# Patient Record
Sex: Male | Born: 1945
Health system: Southern US, Community
[De-identification: ages and names within clinical notes are randomized; demographics above are authoritative.]

## PROBLEM LIST (undated history)

## (undated) DIAGNOSIS — S3282XA Multiple fractures of pelvis without disruption of pelvic ring, initial encounter for closed fracture: Secondary | ICD-10-CM

## (undated) DIAGNOSIS — N35013 Post-traumatic anterior urethral stricture: Secondary | ICD-10-CM

## (undated) DIAGNOSIS — R972 Elevated prostate specific antigen [PSA]: Secondary | ICD-10-CM

## (undated) DIAGNOSIS — K439 Ventral hernia without obstruction or gangrene: Secondary | ICD-10-CM

## (undated) DIAGNOSIS — C61 Malignant neoplasm of prostate: Secondary | ICD-10-CM

## (undated) DIAGNOSIS — N320 Bladder-neck obstruction: Secondary | ICD-10-CM

---

## 2001-11-29 ENCOUNTER — Ambulatory Visit (HOSPITAL_COMMUNITY): Admission: RE | Admit: 2001-11-29 | Discharge: 2001-11-29 | Payer: Self-pay | Admitting: Gastroenterology

## 2001-11-29 ENCOUNTER — Encounter (INDEPENDENT_AMBULATORY_CARE_PROVIDER_SITE_OTHER): Payer: Self-pay | Admitting: *Deleted

## 2002-08-24 ENCOUNTER — Ambulatory Visit (HOSPITAL_COMMUNITY): Admission: RE | Admit: 2002-08-24 | Discharge: 2002-08-24 | Payer: Self-pay

## 2003-07-18 ENCOUNTER — Ambulatory Visit (HOSPITAL_COMMUNITY): Admission: RE | Admit: 2003-07-18 | Discharge: 2003-07-18 | Payer: Self-pay | Admitting: Sports Medicine

## 2010-02-06 ENCOUNTER — Encounter: Admission: RE | Admit: 2010-02-06 | Discharge: 2010-02-06 | Payer: Self-pay | Admitting: Internal Medicine

## 2010-08-02 NOTE — Op Note (Signed)
   NAME:  [facility], [facility] NO.:  [id number]   MEDICAL RECORD NO.:  [id number]                   PATIENT TYPE:  AMB   LOCATION:  ENDO                                 FACILITY:  [facility]   PHYSICIAN:  Florencia Reasons, M.D.             DATE OF BIRTH:  1945/10/21   DATE OF PROCEDURE:  11/29/2001  DATE OF DISCHARGE:                                 OPERATIVE REPORT   PROCEDURE:  Colonoscopy with polypectomy and biopsy.   INDICATIONS:  A 65 year old, for colon cancer screening.  Does have  occasional rectal bleeding.   FINDINGS:  One medium polyp and one small polyp removed.  Sigmoid  diverticulosis.   DESCRIPTION OF PROCEDURE:  The nature, purpose, and risks of the procedure  had been discussed with the patient, who provided written consent.  Sedation  was fentanyl 75 mcg and Versed 5 mg IV without arrhythmias or desaturation.  Digital exam of the prostate was normal.   The Olympus adjustable-tension pediatric video colonoscope was advanced with  slight looping to the cecum, using some external abdominal compression to  decrease looping and help advancement.  Pullback was then performed.  The  quality of the prep was excellent, and it is felt that all areas were well-  seen.   On the way in (measured at about 60 cm on pullback), I encountered a  pedunculated 8 mm polyp on a thin stalk, removed by snare technique with  complete hemostasis and no evidence of excessive cautery.   In the proximal ascending colon, there was a small sessile polyp removed by  two cold biopsies.   No large polyps, cancer, colitis, or vascular malformations were observed.   There was moderate sigmoid diverticulosis.   Retroflexion in the rectum was normal, as was reinspection of the  rectosigmoid.   The patient tolerated the procedure well, and there were no apparent  complications.    IMPRESSION:  1. Two colon polyps removed as described as above.  2. Sigmoid  diverticulosis.   PLAN:  Await pathology on the polyps.                                               Florencia Reasons, M.D.    RVB/MEDQ  D:  11/29/2001  T:  11/29/2001  Job:  [id number]   cc:   Ike Bene, MD  [address]  [place]  [place] [address]  Fax: [phone]

## 2011-04-01 ENCOUNTER — Other Ambulatory Visit: Payer: Self-pay | Admitting: Dermatology

## 2012-08-18 ENCOUNTER — Other Ambulatory Visit: Payer: Self-pay | Admitting: Gastroenterology

## 2013-03-17 DIAGNOSIS — N35013 Post-traumatic anterior urethral stricture: Secondary | ICD-10-CM

## 2013-03-17 DIAGNOSIS — R972 Elevated prostate specific antigen [PSA]: Secondary | ICD-10-CM

## 2013-03-17 DIAGNOSIS — N320 Bladder-neck obstruction: Secondary | ICD-10-CM

## 2013-03-17 DIAGNOSIS — K439 Ventral hernia without obstruction or gangrene: Secondary | ICD-10-CM

## 2013-03-17 DIAGNOSIS — C61 Malignant neoplasm of prostate: Secondary | ICD-10-CM

## 2013-03-17 HISTORY — DX: Post-traumatic anterior urethral stricture: N35.013

## 2013-03-17 HISTORY — DX: Bladder-neck obstruction: N32.0

## 2013-03-17 HISTORY — DX: Ventral hernia without obstruction or gangrene: K43.9

## 2013-03-17 HISTORY — DX: Malignant neoplasm of prostate: C61

## 2013-03-17 HISTORY — DX: Elevated prostate specific antigen (PSA): R97.20

## 2013-06-24 HISTORY — PX: PROSTATE BIOPSY: SHX241

## 2013-10-11 HISTORY — PX: ROBOT ASSISTED LAPAROSCOPIC RADICAL PROSTATECTOMY: SHX5141

## 2014-01-05 HISTORY — PX: INTERNAL URETHROTOMY: SHX1835

## 2014-01-05 HISTORY — PX: CYSTOSCOPY: SUR368

## 2015-04-05 DIAGNOSIS — L812 Freckles: Secondary | ICD-10-CM | POA: Diagnosis not present

## 2015-04-05 DIAGNOSIS — L570 Actinic keratosis: Secondary | ICD-10-CM | POA: Diagnosis not present

## 2015-04-05 DIAGNOSIS — L308 Other specified dermatitis: Secondary | ICD-10-CM | POA: Diagnosis not present

## 2015-04-05 DIAGNOSIS — D225 Melanocytic nevi of trunk: Secondary | ICD-10-CM | POA: Diagnosis not present

## 2015-04-05 DIAGNOSIS — L821 Other seborrheic keratosis: Secondary | ICD-10-CM | POA: Diagnosis not present

## 2015-04-05 DIAGNOSIS — D1801 Hemangioma of skin and subcutaneous tissue: Secondary | ICD-10-CM | POA: Diagnosis not present

## 2015-11-29 DIAGNOSIS — E784 Other hyperlipidemia: Secondary | ICD-10-CM | POA: Diagnosis not present

## 2015-11-29 DIAGNOSIS — Z125 Encounter for screening for malignant neoplasm of prostate: Secondary | ICD-10-CM | POA: Diagnosis not present

## 2015-11-29 DIAGNOSIS — R7301 Impaired fasting glucose: Secondary | ICD-10-CM | POA: Diagnosis not present

## 2015-11-29 DIAGNOSIS — M859 Disorder of bone density and structure, unspecified: Secondary | ICD-10-CM | POA: Diagnosis not present

## 2015-12-06 DIAGNOSIS — E784 Other hyperlipidemia: Secondary | ICD-10-CM | POA: Diagnosis not present

## 2015-12-06 DIAGNOSIS — D7589 Other specified diseases of blood and blood-forming organs: Secondary | ICD-10-CM | POA: Diagnosis not present

## 2015-12-06 DIAGNOSIS — F5221 Male erectile disorder: Secondary | ICD-10-CM | POA: Diagnosis not present

## 2015-12-06 DIAGNOSIS — Z23 Encounter for immunization: Secondary | ICD-10-CM | POA: Diagnosis not present

## 2015-12-06 DIAGNOSIS — C61 Malignant neoplasm of prostate: Secondary | ICD-10-CM | POA: Diagnosis not present

## 2015-12-06 DIAGNOSIS — M25551 Pain in right hip: Secondary | ICD-10-CM | POA: Diagnosis not present

## 2015-12-06 DIAGNOSIS — M5489 Other dorsalgia: Secondary | ICD-10-CM | POA: Diagnosis not present

## 2015-12-06 DIAGNOSIS — K649 Unspecified hemorrhoids: Secondary | ICD-10-CM | POA: Diagnosis not present

## 2015-12-06 DIAGNOSIS — K219 Gastro-esophageal reflux disease without esophagitis: Secondary | ICD-10-CM | POA: Diagnosis not present

## 2015-12-06 DIAGNOSIS — Z6827 Body mass index (BMI) 27.0-27.9, adult: Secondary | ICD-10-CM | POA: Diagnosis not present

## 2015-12-06 DIAGNOSIS — Z1389 Encounter for screening for other disorder: Secondary | ICD-10-CM | POA: Diagnosis not present

## 2015-12-06 DIAGNOSIS — Z0000 Encounter for general adult medical examination without abnormal findings: Secondary | ICD-10-CM | POA: Diagnosis not present

## 2015-12-10 DIAGNOSIS — Z1212 Encounter for screening for malignant neoplasm of rectum: Secondary | ICD-10-CM | POA: Diagnosis not present

## 2016-01-03 DIAGNOSIS — L821 Other seborrheic keratosis: Secondary | ICD-10-CM | POA: Diagnosis not present

## 2016-01-03 DIAGNOSIS — D1801 Hemangioma of skin and subcutaneous tissue: Secondary | ICD-10-CM | POA: Diagnosis not present

## 2016-01-03 DIAGNOSIS — L570 Actinic keratosis: Secondary | ICD-10-CM | POA: Diagnosis not present

## 2016-01-03 DIAGNOSIS — L812 Freckles: Secondary | ICD-10-CM | POA: Diagnosis not present

## 2016-02-28 DIAGNOSIS — H52223 Regular astigmatism, bilateral: Secondary | ICD-10-CM | POA: Diagnosis not present

## 2016-02-28 DIAGNOSIS — H524 Presbyopia: Secondary | ICD-10-CM | POA: Diagnosis not present

## 2016-02-28 DIAGNOSIS — H25813 Combined forms of age-related cataract, bilateral: Secondary | ICD-10-CM | POA: Diagnosis not present

## 2016-02-28 DIAGNOSIS — H5213 Myopia, bilateral: Secondary | ICD-10-CM | POA: Diagnosis not present

## 2016-04-14 DIAGNOSIS — M859 Disorder of bone density and structure, unspecified: Secondary | ICD-10-CM | POA: Diagnosis not present

## 2016-05-14 DIAGNOSIS — C61 Malignant neoplasm of prostate: Secondary | ICD-10-CM | POA: Diagnosis not present

## 2016-07-11 DIAGNOSIS — N35013 Post-traumatic anterior urethral stricture: Secondary | ICD-10-CM | POA: Diagnosis not present

## 2016-07-11 DIAGNOSIS — R972 Elevated prostate specific antigen [PSA]: Secondary | ICD-10-CM | POA: Diagnosis not present

## 2016-07-11 DIAGNOSIS — C61 Malignant neoplasm of prostate: Secondary | ICD-10-CM | POA: Diagnosis not present

## 2016-07-16 ENCOUNTER — Encounter: Payer: Self-pay | Admitting: Radiation Oncology

## 2016-07-21 ENCOUNTER — Encounter: Payer: Self-pay | Admitting: Radiation Oncology

## 2016-07-21 NOTE — Progress Notes (Signed)
GU Location of Tumor / Histology: Recurrent Prostate Cancer  Patient had prostate cancer and underwent a robotic-assisted laparoscopic radical prostatectomy on 10/12/13. His final pathology was Gleason score 3+4, but there was some tertiary 5. It was stage pT3a. He had some focal extraprostatic extension with focal positive margins, and an apical margin that did have some 3+4 in it.He had a urethral stricture and underwent a DVIU on 01/05/14 and his initial PSA was less than 0.01 on 12/05/13. A PSA by Dr. Joylene Draft was 0.052 on 12/06/15. He comes in for evaluation. His last PSA was 0.079 on 05/14/16.  [facility] presented to Dr. Tresa Endo [facility]) on 07/11/2016 for follow up following radical prostatectomy.  Past/Anticipated interventions by urology, if any: prostatectomy on 10/12/13  Past/Anticipated interventions by medical oncology, if any: no  Weight changes, if any: no  Bowel/Bladder complaints, if any: IPSS 6. Denies dysuria or hematuria. Reports occasional leakage which require him to "wear one tissue per day." Explains his leakage is no worse but, was present prior to prostatectomy.    Nausea/Vomiting, if any: no  Pain issues, if any:  Chronic lumbar spine pain related to old degenerative fracture that doesn't require medication for management.  SAFETY ISSUES:  Prior radiation? No  Pacemaker/ICD? no  Possible current pregnancy? no  Is the patient on methotrexate? 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  Married, Wife's name is Vaughan Basta.  Patient is well versed in the treatment options from previous prostate diagnosis and the several consultations that he had with [place].  Patient would prefer if treatment is recommended and agreed upon to have it done in [place] and will return to [name] at [facility] in 4 months for follow up.  Reports he is not ready to begin treatment just wants to establish care and  hear his options.

## 2016-07-22 DIAGNOSIS — C61 Malignant neoplasm of prostate: Secondary | ICD-10-CM | POA: Insufficient documentation

## 2016-07-23 ENCOUNTER — Ambulatory Visit
Admission: RE | Admit: 2016-07-23 | Discharge: 2016-07-23 | Disposition: A | Discharge status: Home / Self Care | Payer: PPO | Source: Ambulatory Visit | Attending: Radiation Oncology | Admitting: Radiation Oncology

## 2016-07-23 ENCOUNTER — Encounter: Payer: Self-pay | Admitting: Radiation Oncology

## 2016-07-23 DIAGNOSIS — C61 Malignant neoplasm of prostate: Secondary | ICD-10-CM | POA: Diagnosis not present

## 2016-07-23 DIAGNOSIS — Z9079 Acquired absence of other genital organ(s): Secondary | ICD-10-CM | POA: Diagnosis not present

## 2016-07-23 DIAGNOSIS — R9721 Rising PSA following treatment for malignant neoplasm of prostate: Secondary | ICD-10-CM | POA: Diagnosis not present

## 2016-07-23 HISTORY — DX: Post-traumatic anterior urethral stricture: N35.013

## 2016-07-23 HISTORY — DX: Malignant neoplasm of prostate: C61

## 2016-07-23 HISTORY — DX: Ventral hernia without obstruction or gangrene: K43.9

## 2016-07-23 HISTORY — DX: Elevated prostate specific antigen (PSA): R97.20

## 2016-07-23 HISTORY — DX: Bladder-neck obstruction: N32.0

## 2016-07-23 NOTE — Progress Notes (Signed)
Radiation Oncology         [phone] ________________________________  Initial outpatient Consultation  Name: [facility] MRN: [id number]  Date: 07/23/2016  DOB: Feb 05, 1946  TA:VWPVXY, Elta Guadeloupe, MD  [name], MD   REFERRING PHYSICIAN: [name], MD  DIAGNOSIS: The encounter diagnosis was Malignant neoplasm of prostate [facility]).    ICD-9-CM ICD-10-CM   1. Malignant neoplasm of prostate ([place]) 60 C61    71 y.o. gentleman with biochemical recurrence of Stage pT3aNx adenocarcinoma of the prostate, Gleason Score of 3+4 with tertiary Gleason pattern 5; PSA of 5.5  HISTORY OF PRESENT ILLNESS: Michael [facility] is a 71 y.o. male with a diagnosis of prostate cancer. He was initially noted to have an elevated PSA of 5.5.  Accordingly, he was referred for evaluation in urology by [name] in 2015, digital rectal examination was performed at that time revealing a palpable nodule.  The patient proceeded to transrectal ultrasound with 8 biopsies of the prostate on 06/24/2013. Out of 8 core biopsies, 3 were positive.  The maximum Gleason score was 4+3, and this was seen in the right posterior apex.  Additionally, he was noted to have 3+4 in the right posterior base and 3+3 in the left anterior apex.  He elected to proceed with robotic assisted laparoscopic prostatectomy by Dr. Felicie Morn at [facility] on 10/12/13.  Pathology did reveal stage T3a prostatic adenocarcinoma, Gleason score 3+4=7, with tertiary Gleason pattern 5. The tumor involved both lobes of the prostate and focal extraprostatic extension was identified. Pathology was negative for lymphovascular invasion or perinueral invasion. The distal anterior apical margin was focally involved by invasive carcinoma. Resection of the right seminal vesicle was benign and biopsy of the anterior urethral tissue revealed prostatic adenocarcinoma, Gleason 3+4=7, involving 30% of the tissue.  His initial postop PSA was <0.001 on 12/08/13.  PSA was noted  to be elevated at 0.052 on 12/06/15 and further elevated at 0.079 on 05/14/16. The patient has reviewed the PSA results with his urologist and he has kindly been referred today for discussion salvage radiation.  PREVIOUS RADIATION THERAPY: No  PAST MEDICAL HISTORY:  Past Medical History:  Diagnosis Date  . Bladder neck obstruction 2015  . Cancer of prostate with intermediate recurrence risk (stage T2b-c or Gleason 7 or PSA 10-20) ([place]) 2015  . Elevated prostate specific antigen (PSA) 2015  . Hernia of anterior abdominal wall 2015  . Malignant tumor of prostate ([place]) 2015  . Post-traumatic anterior urethral stricture 2015      PAST SURGICAL HISTORY: Past Surgical History:  Procedure Laterality Date  . CYSTOSCOPY  01/05/2014  . INTERNAL URETHROTOMY  01/05/2014  . PROSTATE BIOPSY  06/24/2013   Dr. Nelma Rothman III  . ROBOT ASSISTED LAPAROSCOPIC RADICAL PROSTATECTOMY  10/11/2013   [facility]    FAMILY HISTORY:  Family History  Problem Relation Age of Onset  . Cancer Brother     younger brother/prostate ca/tx with combo chemo, adt, radiation  . Cancer Maternal Grandmother     bladder    SOCIAL HISTORY:  Social History   Social History  . Marital status: Single    Spouse name: N/A  . Number of children: N/A  . Years of education: N/A   Occupational History  . Not on file.   Social History Main Topics  . Smoking status: Never Smoker  . Smokeless tobacco: Never Used  . Alcohol use No  . Drug use: No  . Sexual activity: Not  Currently   Other Topics Concern  . Not on file   Social History Narrative  . No narrative on file    ALLERGIES: Patient has no known allergies.  MEDICATIONS:  Current Outpatient Prescriptions  Medication Sig Dispense Refill  . aspirin EC 81 MG tablet Take by mouth.    Marland Kitchen atorvastatin (LIPITOR) 40 MG tablet Take by mouth.    . Cholecalciferol (VITAMIN D-1000 MAX ST) 1000 units tablet Take by mouth.    . Multiple Vitamins-Minerals  (CENTRUM SILVER 50+MEN PO) Take by mouth.    Marland Kitchen omeprazole (PRILOSEC) 2 mg/mL SUSP Take by mouth.     No current facility-administered medications for this encounter.     REVIEW OF SYSTEMS:  On review of systems, the patient reports that he is doing well overall. He denies any chest pain, shortness of breath, cough, fevers, chills, night sweats, unintended weight changes. He denies any bowel disturbances, and denies abdominal pain, nausea or vomiting. He denies any new musculoskeletal or joint aches or pains. However, he has chronic lower back pain relate to an old degenerative fracture that does not require medication for management. His IPSS was 6, indicating mild urinary symptoms. He reports occasional urinary leakage which requires him to wear "one tissue per day." His leakage was present prior to his prostatectomy and is not worse. He is unable to complete sexual activity. A complete review of systems is obtained and is otherwise negative.    PHYSICAL EXAM:  Wt Readings from Last 3 Encounters:  07/23/16 188 lb 12.8 oz (85.6 kg)   Temp Readings from Last 3 Encounters:  07/23/16 98 F (36.7 C) (Oral)   BP Readings from Last 3 Encounters:  07/23/16 (!) 148/77   Pulse Readings from Last 3 Encounters:  07/23/16 64   Pain Assessment Pain Score: 0-No pain/10  In general this is a well appearing Caucasian male in no acute distress. He is alert and oriented x4 and appropriate throughout the examination. HEENT reveals that the patient is normocephalic, atraumatic. EOMs are intact. PERRLA. Skin is intact without any evidence of gross lesions. Cardiovascular exam reveals a regular rate and rhythm, no clicks rubs or murmurs are auscultated. Chest is clear to auscultation bilaterally. Lymphatic assessment is performed and does not reveal any adenopathy in the cervical, supraclavicular, axillary, or inguinal chains. Abdomen has active bowel sounds in all quadrants and is intact. The abdomen is soft,  non tender, non distended. Lower extremities are negative for pretibial pitting edema, deep calf tenderness, cyanosis or clubbing.   KPS = 100  100 - Normal; no complaints; no evidence of disease. 90   - Able to carry on normal activity; minor signs or symptoms of disease. 80   - Normal activity with effort; some signs or symptoms of disease. 84   - Cares for self; unable to carry on normal activity or to do active work. 60   - Requires occasional assistance, but is able to care for most of his personal needs. 50   - Requires considerable assistance and frequent medical care. 73   - Disabled; requires special care and assistance. 12   - Severely disabled; hospital admission is indicated although death not imminent. 75   - Very sick; hospital admission necessary; active supportive treatment necessary. 10   - Moribund; fatal processes progressing rapidly. 0     - Dead  Karnofsky DA, Abelmann WH, Craver LS and Burchenal [facility] [phone]) The use of the nitrogen mustards in the palliative treatment of carcinoma:  with particular reference to bronchogenic carcinoma Cancer 1 634-56  LABORATORY DATA:  No results found for: WBC, HGB, HCT, MCV, PLT No results found for: NA, K, CL, CO2 No results found for: ALT, AST, GGT, ALKPHOS, BILITOT   RADIOGRAPHY: No results found.    IMPRESSION/PLAN: 1. 71 y.o. gentleman with biochemical recurrence of Stage pT3aNx adenocarcinoma of the prostate, Gleason Score of 3+4 with tertiary Gleason pattern 5; PSA of 5.5  The patient is status post prostatectomy in July 2015. His initial post-op PSA was <0.001 in September 2015 and it has steadily risen to 0.079 in February 2018. This would likely indicate biochemical recurrence and he appears to be a good candidate for salvage radiation therapy to the prostatic fossa.  Today, we reviewed the findings and workup thus far and we discussed the natural history of prostate cancer.  We reviewed the the implications of the surgical  pathology report and the patient's rising PSA on decision-making and outcomes in prostate cancer.  We discussed salvage radiation in the management of biochemical recurrence of prostate cancer with regard to the logistics and delivery of external beam radiation treatment. The patient expressed interest in external beam radiotherapy.  The patient is leaning towards moving forward with IMRT to the prostatic fossa versus continued observation until PSA reaches a level of 0.2.  He will contact us in the near future when he is ready to move forward with radiotherapy.  We will share our findings with [name].  I enjoyed meeting with him today, and will look forward to participating in the care of this very nice gentleman.     [name], PA-C    Tyler Pita, MD  [facility] Direct Dial: [phone]  Fax: [phone] [email or web address]  Skype  LinkedIn  This document serves as a record of services personally performed by Freeman Caldron, PA-C and Tyler Pita, MD. It was created on their behalf by Darcus Austin, a trained medical scribe. The creation of this record is based on the scribe's personal observations and the providers' statements to them. This document has been checked and approved by the attending provider.

## 2016-07-23 NOTE — Progress Notes (Signed)
See progress note under physician encounter. 

## 2016-12-04 DIAGNOSIS — F5221 Male erectile disorder: Secondary | ICD-10-CM | POA: Diagnosis not present

## 2016-12-05 ENCOUNTER — Encounter: Payer: Self-pay | Admitting: Radiation Oncology

## 2016-12-12 NOTE — Progress Notes (Signed)
GU Location of Tumor / Histology: Recurrent Prostate Cancer  FUN Mr. Ohman has made contact to let us know he is ready to move forward with radiotherapy.  Patient had prostate cancer and underwent a robotic-assisted laparoscopic radical prostatectomy on 10/12/13. His final pathology was Gleason score 3+4, but there was some tertiary 5. It was stage pT3a. He had some focal extraprostatic extension with focal positive margins, and an apical margin that did have some 3+4 in it.He had a urethral stricture and underwent a DVIU on 01/05/14 and his initial PSA was less than 0.01 on 12/05/13. A PSA by Dr. Joylene Draft was 0.052 on 12/06/15. He comes in for evaluation. His last PSA was 0.079 on 05/14/16.  Jarold Song presented to Dr. Tresa Endo [facility]) on 07/11/2016 for follow up following radical prostatectomy.  Past/Anticipated interventions by urology, if any: prostatectomy on 10/12/13  Past/Anticipated interventions by medical oncology, if any: No  Weight changes, if any:  Wt Readings from Last 3 Encounters:  12/17/16 190 lb 9.6 oz (86.5 kg)  07/23/16 188 lb 12.8 oz (85.6 kg)    Bowel/Bladder complaints, if any: IPSS 2. 12-17-16 Denies having any urinary concerns. Denies dysuria or hematuria. Reports occasional leakage which require him to "wear one tissue per day." ongoing.Explains his leakage is no worse but, was present prior to prostatectomy.    Nausea/Vomiting, if any:No  Pain issues, if any:12-17-16  ,Chronic lumbar spine pain related to old degenerative fracture that doesn't require medication for management ongoing no problems now.  SAFETY ISSUES:  Prior radiation? No  Pacemaker/ICD? No  Possible current pregnancy? No  Is the patient on methotrexate? 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  Patient is well versed in the treatment options from previous prostate diagnosis and the  several consultations that he had with [place].  Patient would prefer if treatment is recommended and agreed upon to have it done in [place] and will return to [name] at [facility] in 4 months for follow up. Will see [name] January 12, 2017 for a follow up visit.  Reports he is not ready to begin treatment just wants to establish care and hear his options.       Wt Readings from Last 3 Encounters:  12/17/16 190 lb 9.6 oz (86.5 kg)  07/23/16 188 lb 12.8 oz (85.6 kg)  BP 125/69   Pulse 64   Temp 98.1 F (36.7 C) (Oral)   Resp 18   Ht 5\' 9"  (1.753 m)   Wt 190 lb 9.6 oz (86.5 kg)   SpO2 99%   BMI 28.15 kg/m

## 2016-12-17 ENCOUNTER — Ambulatory Visit
Admission: RE | Admit: 2016-12-17 | Discharge: 2016-12-17 | Disposition: A | Discharge status: Home / Self Care | Payer: PPO | Source: Ambulatory Visit | Attending: Urology | Admitting: Urology

## 2016-12-17 ENCOUNTER — Ambulatory Visit
Admission: RE | Admit: 2016-12-17 | Discharge: 2016-12-17 | Disposition: A | Discharge status: Home / Self Care | Payer: PPO | Source: Ambulatory Visit | Attending: Radiation Oncology | Admitting: Radiation Oncology

## 2016-12-17 ENCOUNTER — Encounter: Payer: Self-pay | Admitting: Urology

## 2016-12-17 VITALS — BP 125/69 | HR 64 | Temp 98.1°F | Resp 18 | Ht 69.0 in | Wt 190.6 lb

## 2016-12-17 DIAGNOSIS — Z7982 Long term (current) use of aspirin: Secondary | ICD-10-CM | POA: Insufficient documentation

## 2016-12-17 DIAGNOSIS — Z79899 Other long term (current) drug therapy: Secondary | ICD-10-CM | POA: Insufficient documentation

## 2016-12-17 DIAGNOSIS — N401 Enlarged prostate with lower urinary tract symptoms: Secondary | ICD-10-CM | POA: Diagnosis not present

## 2016-12-17 DIAGNOSIS — C61 Malignant neoplasm of prostate: Secondary | ICD-10-CM

## 2016-12-17 DIAGNOSIS — Z9079 Acquired absence of other genital organ(s): Secondary | ICD-10-CM | POA: Diagnosis not present

## 2016-12-17 DIAGNOSIS — R9721 Rising PSA following treatment for malignant neoplasm of prostate: Secondary | ICD-10-CM | POA: Diagnosis not present

## 2016-12-17 NOTE — Progress Notes (Signed)
Radiation Oncology         [phone] ________________________________  Name: Michael Turner MRN: [id number]  Date of Service: 12/17/2016 DOB: August 16, 1945  Re-Consult Note  CC: Michael Infante, MD  Michael Broker, MD  Diagnosis:   71 y.o. gentleman with biochemical recurrence of Stage pT3aNx adenocarcinoma of the prostate, Gleason Score of 3+4 with tertiary Gleason pattern 5; with pretreatment PSA of 5.5, and current post treatment PSA is 0.16.  Narrative:  The patient returns today for re-consult.  He was originally seen for consultation in our clinic on 07/23/16. At our last visit, we discussed the option to proceed with IMRT to the prostatic fossa versus continued observation until PSA reaches a level of 0.2 and the patient elected to proceed with close observation at that time.   In summary, he is s/p robotic assisted laparoscopic prostatectomy by Dr. Felicie [facility] at [facility] on 10/12/13 for T2a adenocarcinoma of the prostate, Gleason Score of 3+4 and pretreatment PSA of 5.5. Pathology did reveal stage pT3a prostatic adenocarcinoma, Gleason score 3+4=7, with tertiary Gleason pattern 5. The tumor involved both lobes of the prostate and focal extraprostatic extension was identified. Pathology was negative for lymphovascular invasion or perinueral invasion. The distal anterior apical margin was focally involved by invasive carcinoma. Resection of the right seminal vesicle was benign and biopsy of the anterior urethral tissue revealed prostatic adenocarcinoma, Gleason 3+4=7, involving 30% of the tissue.  His initial postop PSA was <0.001 on 12/08/13.  PSA was noted to be elevated at 0.052 on 12/06/15 and further elevated at 0.079 on 05/14/16.  His most recent PSA was 0.16 (previously 0.052) on 12/04/16.   He is well versed in the treatment options from previous prostate diagnosis and several consultations he has had with [facility]. At this time, he would like to move forward with salvage  radiotherapy to the prostate fossa and would prefer to have treatment in [place] since this is close to his home. He is scheduled for follow-up with urologist, Dr. Rosana Turner, at [facility] on 01/12/17.  On review of systems, the patient states he is doing well overall. He is accompanied by his wife today. He denies abdominal pain, diarrhea, constipation, nausea or vomiting. He reports chronic lumbar spine pain related to an old degenerative fracture that doesn't require medication for management. The patient currently takes psyllium fiber to avoid constipation. IPSS is 2 indicating mild LUTS. He denies dysuria or hematuria. He does report occasional leakage which requires him to "wear one tissue in his shorts per day". He explains his leakage is no worse following surgery and was present prior to prostatectomy. He has ED.  ALLERGIES:  has No Known Allergies.  Meds: Current Outpatient Prescriptions  Medication Sig Dispense Refill  . aspirin EC 81 MG tablet Take by mouth.    Marland Kitchen atorvastatin (LIPITOR) 40 MG tablet Take by mouth.    . Cholecalciferol (VITAMIN D-1000 MAX ST) 1000 units tablet Take by mouth.    . Multiple Vitamins-Minerals (CENTRUM SILVER 50+MEN PO) Take by mouth.    Marland Kitchen omeprazole (PRILOSEC) 2 mg/mL SUSP Take by mouth.     No current facility-administered medications for this encounter.     Physical Findings:  height is 5\' 9"  (1.753 m) and weight is 190 lb 9.6 oz (86.5 kg). His oral temperature is 98.1 F (36.7 C). His blood pressure is 125/69 and his pulse is 64. His respiration is 18 and oxygen saturation is 99%.  Pain Assessment Pain Score:  0-No pain/10 In general this is a well appearing caucasian male in no acute distress. He's alert and oriented x4 and appropriate throughout the examination. Cardiopulmonary assessment is negative for acute distress and he exhibits normal effort. There is no cyanosis, clubbing, deep calf tenderness or pitting edema in the lower extremities.   Abdomen is soft and non tender to palpation with normal BS in all quadrants.  Lab Findings: No results found for: WBC, HGB, HCT, MCV, PLT   Radiographic Findings: No results found.  Impression/Plan: 35. 71 year-old gentleman with biochemical recurrence of Stage pT3aNx adenocarcinoma of the prostate, Gleason Score of 3+4 with tertiary Gleason pattern 5; pretreatment PSA of 5.5, and post treatment PSA currently 0.16.  Today, we talked to the patient and his wife about the findings and work-up thus far.  We reviewed the natural history of biochemically recurrent prostate cancer and general treatment, highlighting the role of salvage radiotherapy in the management.  We discussed the available radiation techniques, and focused on the details of logistics and delivery.  We reviewed the anticipated acute and late sequelae associated with radiation in this setting.  The patient was encouraged to ask questions that we answered to the best of our ability. He is interested in proceeding with salvage radiotherapy but would prefer to begin treatment in Jan. 2019.  He is scheduled for follow-up with Dr. Rosana Turner on 01/12/17.  We will move forward with scheduling his CT Simulation in December, in anticipation of beginning treatment in early Jan. 2019, after the holidays.  We enjoyed meeting with him and his wife again today and look forward to participating in the care of this nice gentleman.  We spent 60 minutes minutes face to face with the patient and more than 50% of that time was spent in counseling and/or coordination of care.    [name], PA-C    Tyler Pita, MD  [facility] Direct Dial: [phone]  Fax: [phone] [email or web address]  Skype  LinkedIn   This document serves as a record of services personally performed by Tyler Pita, MD and Freeman Caldron, PA-C. It was created on their behalf by Arlyce Harman, a trained medical scribe. The creation of this  record is based on the scribe's personal observations and the provider's statements to them. This document has been checked and approved by the attending provider.

## 2016-12-22 ENCOUNTER — Telehealth: Payer: Self-pay | Admitting: *Deleted

## 2016-12-22 NOTE — Telephone Encounter (Signed)
CALLED PATIENT TO INFORM OF SIM APPT. ON 03-20-17 @ 11 AM @ DR. MANNING'S OFFICE, SPOKE WITH PATIENT AND HE IS AWARE OF THIS SIM

## 2017-01-08 DIAGNOSIS — D1801 Hemangioma of skin and subcutaneous tissue: Secondary | ICD-10-CM | POA: Diagnosis not present

## 2017-01-08 DIAGNOSIS — D225 Melanocytic nevi of trunk: Secondary | ICD-10-CM | POA: Diagnosis not present

## 2017-01-08 DIAGNOSIS — L570 Actinic keratosis: Secondary | ICD-10-CM | POA: Diagnosis not present

## 2017-01-08 DIAGNOSIS — D2271 Melanocytic nevi of right lower limb, including hip: Secondary | ICD-10-CM | POA: Diagnosis not present

## 2017-01-08 DIAGNOSIS — L578 Other skin changes due to chronic exposure to nonionizing radiation: Secondary | ICD-10-CM | POA: Diagnosis not present

## 2017-01-08 DIAGNOSIS — L821 Other seborrheic keratosis: Secondary | ICD-10-CM | POA: Diagnosis not present

## 2017-01-12 DIAGNOSIS — R9721 Rising PSA following treatment for malignant neoplasm of prostate: Secondary | ICD-10-CM | POA: Diagnosis not present

## 2017-01-20 DIAGNOSIS — R82998 Other abnormal findings in urine: Secondary | ICD-10-CM | POA: Diagnosis not present

## 2017-01-20 DIAGNOSIS — M859 Disorder of bone density and structure, unspecified: Secondary | ICD-10-CM | POA: Diagnosis not present

## 2017-01-20 DIAGNOSIS — R7301 Impaired fasting glucose: Secondary | ICD-10-CM | POA: Diagnosis not present

## 2017-01-20 DIAGNOSIS — E7849 Other hyperlipidemia: Secondary | ICD-10-CM | POA: Diagnosis not present

## 2017-01-27 DIAGNOSIS — Z6827 Body mass index (BMI) 27.0-27.9, adult: Secondary | ICD-10-CM | POA: Diagnosis not present

## 2017-01-27 DIAGNOSIS — Z1389 Encounter for screening for other disorder: Secondary | ICD-10-CM | POA: Diagnosis not present

## 2017-01-27 DIAGNOSIS — D7589 Other specified diseases of blood and blood-forming organs: Secondary | ICD-10-CM | POA: Diagnosis not present

## 2017-01-27 DIAGNOSIS — M79646 Pain in unspecified finger(s): Secondary | ICD-10-CM | POA: Diagnosis not present

## 2017-01-27 DIAGNOSIS — F5221 Male erectile disorder: Secondary | ICD-10-CM | POA: Diagnosis not present

## 2017-01-27 DIAGNOSIS — M5489 Other dorsalgia: Secondary | ICD-10-CM | POA: Diagnosis not present

## 2017-01-27 DIAGNOSIS — Z0000 Encounter for general adult medical examination without abnormal findings: Secondary | ICD-10-CM | POA: Diagnosis not present

## 2017-01-27 DIAGNOSIS — E7849 Other hyperlipidemia: Secondary | ICD-10-CM | POA: Diagnosis not present

## 2017-01-27 DIAGNOSIS — N182 Chronic kidney disease, stage 2 (mild): Secondary | ICD-10-CM | POA: Diagnosis not present

## 2017-01-27 DIAGNOSIS — C61 Malignant neoplasm of prostate: Secondary | ICD-10-CM | POA: Diagnosis not present

## 2017-01-27 DIAGNOSIS — M25551 Pain in right hip: Secondary | ICD-10-CM | POA: Diagnosis not present

## 2017-03-02 DIAGNOSIS — C61 Malignant neoplasm of prostate: Secondary | ICD-10-CM | POA: Diagnosis not present

## 2017-03-06 ENCOUNTER — Ambulatory Visit: Payer: PPO | Admitting: Radiation Oncology

## 2017-03-20 ENCOUNTER — Ambulatory Visit: Payer: PPO | Admitting: Radiation Oncology

## 2017-03-26 ENCOUNTER — Ambulatory Visit: Payer: PPO | Admitting: Radiation Oncology

## 2017-03-26 ENCOUNTER — Ambulatory Visit
Admission: RE | Admit: 2017-03-26 | Discharge: 2017-03-26 | Disposition: A | Discharge status: Home / Self Care | Payer: PPO | Source: Ambulatory Visit | Attending: Radiation Oncology | Admitting: Radiation Oncology

## 2017-03-26 DIAGNOSIS — C61 Malignant neoplasm of prostate: Secondary | ICD-10-CM | POA: Diagnosis not present

## 2017-03-26 DIAGNOSIS — Z510 Encounter for antineoplastic radiation therapy: Secondary | ICD-10-CM | POA: Diagnosis not present

## 2017-03-29 NOTE — Progress Notes (Signed)
  Radiation Oncology         [phone] ________________________________  Name: Michael Turner MRN: [id number]  Date: 03/26/2017  DOB: [birth year]  SIMULATION AND TREATMENT PLANNING NOTE    ICD-10-CM   1. Malignant neoplasm of prostate ([place]) C61     DIAGNOSIS:  72 y.o. gentleman with biochemical recurrence of Stage pT3aNx adenocarcinoma of the prostate, Gleason Score of 3+4 with tertiary Gleason pattern 5; with preop PSA of 5.5, and current post-RP PSA is 0.16.  NARRATIVE:  The patient was brought to the [facility].  Identity was confirmed.  All relevant records and images related to the planned course of therapy were reviewed.  The patient freely provided informed written consent to proceed with treatment after reviewing the details related to the planned course of therapy. The consent form was witnessed and verified by the simulation staff.  Then, the patient was set-up in a stable reproducible supine position for radiation therapy.  A vacuum lock pillow device was custom fabricated to position his legs in a reproducible immobilized position.  Then, I performed a urethrogram under sterile conditions to identify the prostatic apex.  CT images were obtained.  Surface markings were placed.  The CT images were loaded into the planning software.  Then the prostate target and avoidance structures including the rectum, bladder, bowel and hips were contoured.  Treatment planning then occurred.  The radiation prescription was entered and confirmed.  A total of one complex treatment devices was fabricated. I have requested : Intensity Modulated Radiotherapy (IMRT) is medically necessary for this case for the following reason:  Rectal sparing.Marland Kitchen  PLAN:  The patient will receive 68.4 Gy in 38 fractions.  ________________________________  Sheral Apley Tammi Klippel, M.D.

## 2017-04-03 DIAGNOSIS — C61 Malignant neoplasm of prostate: Secondary | ICD-10-CM | POA: Diagnosis not present

## 2017-04-03 DIAGNOSIS — Z510 Encounter for antineoplastic radiation therapy: Secondary | ICD-10-CM | POA: Diagnosis not present

## 2017-04-06 ENCOUNTER — Encounter: Payer: Self-pay | Admitting: Medical Oncology

## 2017-04-06 ENCOUNTER — Ambulatory Visit: Payer: PPO | Admitting: Radiation Oncology

## 2017-04-06 DIAGNOSIS — C61 Malignant neoplasm of prostate: Secondary | ICD-10-CM | POA: Diagnosis not present

## 2017-04-06 DIAGNOSIS — Z510 Encounter for antineoplastic radiation therapy: Secondary | ICD-10-CM | POA: Diagnosis not present

## 2017-04-06 NOTE — Progress Notes (Signed)
I met Mr. Michael Turner and introduced myself as the prostate nurse navigator and my role. He is post robotic prostatectomy 2015 with a rising PSA. He consulted with Dr. Tammi Klippel 07/23/2016 but continued observation. PSA checked 12/04/16 and resulted with an  increase.  He chose to move forward with radiation. He states he has a few questions but will wait to address them with Dr. Tammi Klippel on Friday. I will continue to follow. He did ask about his insurance and payment. I spoke with Ailene Ravel and she will follow up with patient this afternoon. I gave him Meredith's card.

## 2017-04-07 ENCOUNTER — Ambulatory Visit
Admission: RE | Admit: 2017-04-07 | Discharge: 2017-04-07 | Disposition: A | Discharge status: Home / Self Care | Payer: PPO | Source: Ambulatory Visit | Attending: Radiation Oncology | Admitting: Radiation Oncology

## 2017-04-07 DIAGNOSIS — C61 Malignant neoplasm of prostate: Secondary | ICD-10-CM | POA: Diagnosis not present

## 2017-04-07 DIAGNOSIS — Z510 Encounter for antineoplastic radiation therapy: Secondary | ICD-10-CM | POA: Diagnosis not present

## 2017-04-08 ENCOUNTER — Ambulatory Visit
Admission: RE | Admit: 2017-04-08 | Discharge: 2017-04-08 | Disposition: A | Discharge status: Home / Self Care | Payer: PPO | Source: Ambulatory Visit | Attending: Radiation Oncology | Admitting: Radiation Oncology

## 2017-04-08 DIAGNOSIS — Z510 Encounter for antineoplastic radiation therapy: Secondary | ICD-10-CM | POA: Diagnosis not present

## 2017-04-08 DIAGNOSIS — C61 Malignant neoplasm of prostate: Secondary | ICD-10-CM | POA: Diagnosis not present

## 2017-04-09 ENCOUNTER — Ambulatory Visit
Admission: RE | Admit: 2017-04-09 | Discharge: 2017-04-09 | Disposition: A | Discharge status: Home / Self Care | Payer: PPO | Source: Ambulatory Visit | Attending: Radiation Oncology | Admitting: Radiation Oncology

## 2017-04-09 DIAGNOSIS — C61 Malignant neoplasm of prostate: Secondary | ICD-10-CM | POA: Diagnosis not present

## 2017-04-09 DIAGNOSIS — Z510 Encounter for antineoplastic radiation therapy: Secondary | ICD-10-CM | POA: Diagnosis not present

## 2017-04-10 ENCOUNTER — Ambulatory Visit
Admission: RE | Admit: 2017-04-10 | Discharge: 2017-04-10 | Disposition: A | Discharge status: Home / Self Care | Payer: PPO | Source: Ambulatory Visit | Attending: Radiation Oncology | Admitting: Radiation Oncology

## 2017-04-10 DIAGNOSIS — Z510 Encounter for antineoplastic radiation therapy: Secondary | ICD-10-CM | POA: Diagnosis not present

## 2017-04-10 DIAGNOSIS — C61 Malignant neoplasm of prostate: Secondary | ICD-10-CM | POA: Diagnosis not present

## 2017-04-13 ENCOUNTER — Ambulatory Visit
Admission: RE | Admit: 2017-04-13 | Discharge: 2017-04-13 | Disposition: A | Discharge status: Home / Self Care | Payer: PPO | Source: Ambulatory Visit | Attending: Radiation Oncology | Admitting: Radiation Oncology

## 2017-04-13 DIAGNOSIS — Z510 Encounter for antineoplastic radiation therapy: Secondary | ICD-10-CM | POA: Diagnosis not present

## 2017-04-13 DIAGNOSIS — C61 Malignant neoplasm of prostate: Secondary | ICD-10-CM | POA: Diagnosis not present

## 2017-04-14 ENCOUNTER — Ambulatory Visit
Admission: RE | Admit: 2017-04-14 | Discharge: 2017-04-14 | Disposition: A | Discharge status: Home / Self Care | Payer: PPO | Source: Ambulatory Visit | Attending: Radiation Oncology | Admitting: Radiation Oncology

## 2017-04-14 DIAGNOSIS — Z510 Encounter for antineoplastic radiation therapy: Secondary | ICD-10-CM | POA: Diagnosis not present

## 2017-04-14 DIAGNOSIS — C61 Malignant neoplasm of prostate: Secondary | ICD-10-CM | POA: Diagnosis not present

## 2017-04-15 ENCOUNTER — Ambulatory Visit
Admission: RE | Admit: 2017-04-15 | Discharge: 2017-04-15 | Disposition: A | Discharge status: Home / Self Care | Payer: PPO | Source: Ambulatory Visit | Attending: Radiation Oncology | Admitting: Radiation Oncology

## 2017-04-15 DIAGNOSIS — C61 Malignant neoplasm of prostate: Secondary | ICD-10-CM | POA: Diagnosis not present

## 2017-04-15 DIAGNOSIS — Z510 Encounter for antineoplastic radiation therapy: Secondary | ICD-10-CM | POA: Diagnosis not present

## 2017-04-16 ENCOUNTER — Ambulatory Visit
Admission: RE | Admit: 2017-04-16 | Discharge: 2017-04-16 | Disposition: A | Discharge status: Home / Self Care | Payer: PPO | Source: Ambulatory Visit | Attending: Radiation Oncology | Admitting: Radiation Oncology

## 2017-04-16 DIAGNOSIS — Z510 Encounter for antineoplastic radiation therapy: Secondary | ICD-10-CM | POA: Diagnosis not present

## 2017-04-16 DIAGNOSIS — C61 Malignant neoplasm of prostate: Secondary | ICD-10-CM | POA: Diagnosis not present

## 2017-04-17 ENCOUNTER — Ambulatory Visit
Admission: RE | Admit: 2017-04-17 | Discharge: 2017-04-17 | Disposition: A | Discharge status: Home / Self Care | Payer: PPO | Source: Ambulatory Visit | Attending: Radiation Oncology | Admitting: Radiation Oncology

## 2017-04-17 DIAGNOSIS — Z510 Encounter for antineoplastic radiation therapy: Secondary | ICD-10-CM | POA: Diagnosis not present

## 2017-04-17 DIAGNOSIS — C61 Malignant neoplasm of prostate: Secondary | ICD-10-CM | POA: Diagnosis not present

## 2017-04-20 ENCOUNTER — Ambulatory Visit
Admission: RE | Admit: 2017-04-20 | Discharge: 2017-04-20 | Disposition: A | Discharge status: Home / Self Care | Payer: PPO | Source: Ambulatory Visit | Attending: Radiation Oncology | Admitting: Radiation Oncology

## 2017-04-20 DIAGNOSIS — C61 Malignant neoplasm of prostate: Secondary | ICD-10-CM | POA: Diagnosis not present

## 2017-04-20 DIAGNOSIS — Z510 Encounter for antineoplastic radiation therapy: Secondary | ICD-10-CM | POA: Diagnosis not present

## 2017-04-21 ENCOUNTER — Ambulatory Visit
Admission: RE | Admit: 2017-04-21 | Discharge: 2017-04-21 | Disposition: A | Discharge status: Home / Self Care | Payer: PPO | Source: Ambulatory Visit | Attending: Radiation Oncology | Admitting: Radiation Oncology

## 2017-04-21 DIAGNOSIS — C61 Malignant neoplasm of prostate: Secondary | ICD-10-CM | POA: Diagnosis not present

## 2017-04-21 DIAGNOSIS — Z510 Encounter for antineoplastic radiation therapy: Secondary | ICD-10-CM | POA: Diagnosis not present

## 2017-04-22 ENCOUNTER — Ambulatory Visit
Admission: RE | Admit: 2017-04-22 | Discharge: 2017-04-22 | Disposition: A | Discharge status: Home / Self Care | Payer: PPO | Source: Ambulatory Visit | Attending: Radiation Oncology | Admitting: Radiation Oncology

## 2017-04-22 DIAGNOSIS — C61 Malignant neoplasm of prostate: Secondary | ICD-10-CM | POA: Diagnosis not present

## 2017-04-22 DIAGNOSIS — Z510 Encounter for antineoplastic radiation therapy: Secondary | ICD-10-CM | POA: Diagnosis not present

## 2017-04-23 ENCOUNTER — Ambulatory Visit
Admission: RE | Admit: 2017-04-23 | Discharge: 2017-04-23 | Disposition: A | Discharge status: Home / Self Care | Payer: PPO | Source: Ambulatory Visit | Attending: Radiation Oncology | Admitting: Radiation Oncology

## 2017-04-23 DIAGNOSIS — C61 Malignant neoplasm of prostate: Secondary | ICD-10-CM | POA: Diagnosis not present

## 2017-04-23 DIAGNOSIS — Z510 Encounter for antineoplastic radiation therapy: Secondary | ICD-10-CM | POA: Diagnosis not present

## 2017-04-24 ENCOUNTER — Ambulatory Visit
Admission: RE | Admit: 2017-04-24 | Discharge: 2017-04-24 | Disposition: A | Discharge status: Home / Self Care | Payer: PPO | Source: Ambulatory Visit | Attending: Radiation Oncology | Admitting: Radiation Oncology

## 2017-04-24 DIAGNOSIS — C61 Malignant neoplasm of prostate: Secondary | ICD-10-CM | POA: Diagnosis not present

## 2017-04-24 DIAGNOSIS — Z510 Encounter for antineoplastic radiation therapy: Secondary | ICD-10-CM | POA: Diagnosis not present

## 2017-04-27 ENCOUNTER — Ambulatory Visit
Admission: RE | Admit: 2017-04-27 | Discharge: 2017-04-27 | Disposition: A | Discharge status: Home / Self Care | Payer: PPO | Source: Ambulatory Visit | Attending: Radiation Oncology | Admitting: Radiation Oncology

## 2017-04-27 DIAGNOSIS — C61 Malignant neoplasm of prostate: Secondary | ICD-10-CM | POA: Diagnosis not present

## 2017-04-27 DIAGNOSIS — Z510 Encounter for antineoplastic radiation therapy: Secondary | ICD-10-CM | POA: Diagnosis not present

## 2017-04-28 ENCOUNTER — Ambulatory Visit
Admission: RE | Admit: 2017-04-28 | Discharge: 2017-04-28 | Disposition: A | Discharge status: Home / Self Care | Payer: PPO | Source: Ambulatory Visit | Attending: Radiation Oncology | Admitting: Radiation Oncology

## 2017-04-28 ENCOUNTER — Encounter: Payer: Self-pay | Admitting: Medical Oncology

## 2017-04-28 DIAGNOSIS — C61 Malignant neoplasm of prostate: Secondary | ICD-10-CM | POA: Diagnosis not present

## 2017-04-28 DIAGNOSIS — Z510 Encounter for antineoplastic radiation therapy: Secondary | ICD-10-CM | POA: Diagnosis not present

## 2017-04-29 ENCOUNTER — Ambulatory Visit
Admission: RE | Admit: 2017-04-29 | Discharge: 2017-04-29 | Disposition: A | Discharge status: Home / Self Care | Payer: PPO | Source: Ambulatory Visit | Attending: Radiation Oncology | Admitting: Radiation Oncology

## 2017-04-29 DIAGNOSIS — C61 Malignant neoplasm of prostate: Secondary | ICD-10-CM | POA: Diagnosis not present

## 2017-04-29 DIAGNOSIS — Z510 Encounter for antineoplastic radiation therapy: Secondary | ICD-10-CM | POA: Diagnosis not present

## 2017-04-30 ENCOUNTER — Ambulatory Visit
Admission: RE | Admit: 2017-04-30 | Discharge: 2017-04-30 | Disposition: A | Discharge status: Home / Self Care | Payer: PPO | Source: Ambulatory Visit | Attending: Radiation Oncology | Admitting: Radiation Oncology

## 2017-04-30 DIAGNOSIS — Z510 Encounter for antineoplastic radiation therapy: Secondary | ICD-10-CM | POA: Diagnosis not present

## 2017-04-30 DIAGNOSIS — C61 Malignant neoplasm of prostate: Secondary | ICD-10-CM | POA: Diagnosis not present

## 2017-05-01 ENCOUNTER — Ambulatory Visit: Payer: PPO

## 2017-05-04 ENCOUNTER — Ambulatory Visit
Admission: RE | Admit: 2017-05-04 | Discharge: 2017-05-04 | Disposition: A | Discharge status: Home / Self Care | Payer: PPO | Source: Ambulatory Visit | Attending: Radiation Oncology | Admitting: Radiation Oncology

## 2017-05-04 DIAGNOSIS — C61 Malignant neoplasm of prostate: Secondary | ICD-10-CM | POA: Diagnosis not present

## 2017-05-04 DIAGNOSIS — Z510 Encounter for antineoplastic radiation therapy: Secondary | ICD-10-CM | POA: Diagnosis not present

## 2017-05-05 ENCOUNTER — Ambulatory Visit
Admission: RE | Admit: 2017-05-05 | Discharge: 2017-05-05 | Disposition: A | Discharge status: Home / Self Care | Payer: PPO | Source: Ambulatory Visit | Attending: Radiation Oncology | Admitting: Radiation Oncology

## 2017-05-05 DIAGNOSIS — C61 Malignant neoplasm of prostate: Secondary | ICD-10-CM | POA: Diagnosis not present

## 2017-05-05 DIAGNOSIS — Z510 Encounter for antineoplastic radiation therapy: Secondary | ICD-10-CM | POA: Diagnosis not present

## 2017-05-06 ENCOUNTER — Ambulatory Visit
Admission: RE | Admit: 2017-05-06 | Discharge: 2017-05-06 | Disposition: A | Discharge status: Home / Self Care | Payer: PPO | Source: Ambulatory Visit | Attending: Radiation Oncology | Admitting: Radiation Oncology

## 2017-05-06 DIAGNOSIS — C61 Malignant neoplasm of prostate: Secondary | ICD-10-CM | POA: Diagnosis not present

## 2017-05-06 DIAGNOSIS — Z510 Encounter for antineoplastic radiation therapy: Secondary | ICD-10-CM | POA: Diagnosis not present

## 2017-05-07 ENCOUNTER — Ambulatory Visit
Admission: RE | Admit: 2017-05-07 | Discharge: 2017-05-07 | Disposition: A | Discharge status: Home / Self Care | Payer: PPO | Source: Ambulatory Visit | Attending: Radiation Oncology | Admitting: Radiation Oncology

## 2017-05-07 DIAGNOSIS — C61 Malignant neoplasm of prostate: Secondary | ICD-10-CM | POA: Diagnosis not present

## 2017-05-07 DIAGNOSIS — Z510 Encounter for antineoplastic radiation therapy: Secondary | ICD-10-CM | POA: Diagnosis not present

## 2017-05-08 ENCOUNTER — Ambulatory Visit
Admission: RE | Admit: 2017-05-08 | Discharge: 2017-05-08 | Disposition: A | Discharge status: Home / Self Care | Payer: PPO | Source: Ambulatory Visit | Attending: Radiation Oncology | Admitting: Radiation Oncology

## 2017-05-08 DIAGNOSIS — Z510 Encounter for antineoplastic radiation therapy: Secondary | ICD-10-CM | POA: Diagnosis not present

## 2017-05-08 DIAGNOSIS — C61 Malignant neoplasm of prostate: Secondary | ICD-10-CM | POA: Diagnosis not present

## 2017-05-11 ENCOUNTER — Ambulatory Visit
Admission: RE | Admit: 2017-05-11 | Discharge: 2017-05-11 | Disposition: A | Discharge status: Home / Self Care | Payer: PPO | Source: Ambulatory Visit | Attending: Radiation Oncology | Admitting: Radiation Oncology

## 2017-05-11 DIAGNOSIS — Z510 Encounter for antineoplastic radiation therapy: Secondary | ICD-10-CM | POA: Diagnosis not present

## 2017-05-11 DIAGNOSIS — C61 Malignant neoplasm of prostate: Secondary | ICD-10-CM | POA: Diagnosis not present

## 2017-05-12 ENCOUNTER — Ambulatory Visit
Admission: RE | Admit: 2017-05-12 | Discharge: 2017-05-12 | Disposition: A | Discharge status: Home / Self Care | Payer: PPO | Source: Ambulatory Visit | Attending: Radiation Oncology | Admitting: Radiation Oncology

## 2017-05-12 DIAGNOSIS — Z510 Encounter for antineoplastic radiation therapy: Secondary | ICD-10-CM | POA: Diagnosis not present

## 2017-05-12 DIAGNOSIS — C61 Malignant neoplasm of prostate: Secondary | ICD-10-CM | POA: Diagnosis not present

## 2017-05-13 ENCOUNTER — Ambulatory Visit
Admission: RE | Admit: 2017-05-13 | Discharge: 2017-05-13 | Disposition: A | Discharge status: Home / Self Care | Payer: PPO | Source: Ambulatory Visit | Attending: Radiation Oncology | Admitting: Radiation Oncology

## 2017-05-13 DIAGNOSIS — C61 Malignant neoplasm of prostate: Secondary | ICD-10-CM | POA: Diagnosis not present

## 2017-05-13 DIAGNOSIS — Z510 Encounter for antineoplastic radiation therapy: Secondary | ICD-10-CM | POA: Diagnosis not present

## 2017-05-14 ENCOUNTER — Ambulatory Visit
Admission: RE | Admit: 2017-05-14 | Discharge: 2017-05-14 | Disposition: A | Discharge status: Home / Self Care | Payer: PPO | Source: Ambulatory Visit | Attending: Radiation Oncology | Admitting: Radiation Oncology

## 2017-05-14 DIAGNOSIS — Z510 Encounter for antineoplastic radiation therapy: Secondary | ICD-10-CM | POA: Diagnosis not present

## 2017-05-14 DIAGNOSIS — C61 Malignant neoplasm of prostate: Secondary | ICD-10-CM | POA: Diagnosis not present

## 2017-05-15 ENCOUNTER — Ambulatory Visit
Admission: RE | Admit: 2017-05-15 | Discharge: 2017-05-15 | Disposition: A | Discharge status: Home / Self Care | Payer: PPO | Source: Ambulatory Visit | Attending: Urology | Admitting: Urology

## 2017-05-15 DIAGNOSIS — Z510 Encounter for antineoplastic radiation therapy: Secondary | ICD-10-CM | POA: Diagnosis not present

## 2017-05-15 DIAGNOSIS — C61 Malignant neoplasm of prostate: Secondary | ICD-10-CM | POA: Insufficient documentation

## 2017-05-18 ENCOUNTER — Ambulatory Visit
Admission: RE | Admit: 2017-05-18 | Discharge: 2017-05-18 | Disposition: A | Discharge status: Home / Self Care | Payer: PPO | Source: Ambulatory Visit | Attending: Radiation Oncology | Admitting: Radiation Oncology

## 2017-05-18 DIAGNOSIS — Z510 Encounter for antineoplastic radiation therapy: Secondary | ICD-10-CM | POA: Diagnosis not present

## 2017-05-18 DIAGNOSIS — C61 Malignant neoplasm of prostate: Secondary | ICD-10-CM | POA: Diagnosis not present

## 2017-05-19 ENCOUNTER — Ambulatory Visit
Admission: RE | Admit: 2017-05-19 | Discharge: 2017-05-19 | Disposition: A | Discharge status: Home / Self Care | Payer: PPO | Source: Ambulatory Visit | Attending: Radiation Oncology | Admitting: Radiation Oncology

## 2017-05-19 DIAGNOSIS — C61 Malignant neoplasm of prostate: Secondary | ICD-10-CM | POA: Diagnosis not present

## 2017-05-19 DIAGNOSIS — Z510 Encounter for antineoplastic radiation therapy: Secondary | ICD-10-CM | POA: Diagnosis not present

## 2017-05-20 ENCOUNTER — Ambulatory Visit
Admission: RE | Admit: 2017-05-20 | Discharge: 2017-05-20 | Disposition: A | Discharge status: Home / Self Care | Payer: PPO | Source: Ambulatory Visit | Attending: Radiation Oncology | Admitting: Radiation Oncology

## 2017-05-20 DIAGNOSIS — Z510 Encounter for antineoplastic radiation therapy: Secondary | ICD-10-CM | POA: Diagnosis not present

## 2017-05-20 DIAGNOSIS — C61 Malignant neoplasm of prostate: Secondary | ICD-10-CM | POA: Diagnosis not present

## 2017-05-21 ENCOUNTER — Ambulatory Visit
Admission: RE | Admit: 2017-05-21 | Discharge: 2017-05-21 | Disposition: A | Discharge status: Home / Self Care | Payer: PPO | Source: Ambulatory Visit | Attending: Radiation Oncology | Admitting: Radiation Oncology

## 2017-05-21 DIAGNOSIS — C61 Malignant neoplasm of prostate: Secondary | ICD-10-CM | POA: Diagnosis not present

## 2017-05-21 DIAGNOSIS — Z510 Encounter for antineoplastic radiation therapy: Secondary | ICD-10-CM | POA: Diagnosis not present

## 2017-05-22 ENCOUNTER — Ambulatory Visit
Admission: RE | Admit: 2017-05-22 | Discharge: 2017-05-22 | Disposition: A | Discharge status: Home / Self Care | Payer: PPO | Source: Ambulatory Visit | Attending: Radiation Oncology | Admitting: Radiation Oncology

## 2017-05-22 DIAGNOSIS — Z510 Encounter for antineoplastic radiation therapy: Secondary | ICD-10-CM | POA: Diagnosis not present

## 2017-05-22 DIAGNOSIS — C61 Malignant neoplasm of prostate: Secondary | ICD-10-CM | POA: Diagnosis not present

## 2017-05-25 ENCOUNTER — Ambulatory Visit
Admission: RE | Admit: 2017-05-25 | Discharge: 2017-05-25 | Disposition: A | Discharge status: Home / Self Care | Payer: PPO | Source: Ambulatory Visit | Attending: Radiation Oncology | Admitting: Radiation Oncology

## 2017-05-25 DIAGNOSIS — C61 Malignant neoplasm of prostate: Secondary | ICD-10-CM | POA: Diagnosis not present

## 2017-05-25 DIAGNOSIS — Z510 Encounter for antineoplastic radiation therapy: Secondary | ICD-10-CM | POA: Diagnosis not present

## 2017-05-26 ENCOUNTER — Ambulatory Visit
Admission: RE | Admit: 2017-05-26 | Discharge: 2017-05-26 | Disposition: A | Discharge status: Home / Self Care | Payer: PPO | Source: Ambulatory Visit | Attending: Radiation Oncology | Admitting: Radiation Oncology

## 2017-05-26 DIAGNOSIS — C61 Malignant neoplasm of prostate: Secondary | ICD-10-CM | POA: Diagnosis not present

## 2017-05-26 DIAGNOSIS — Z510 Encounter for antineoplastic radiation therapy: Secondary | ICD-10-CM | POA: Diagnosis not present

## 2017-05-27 ENCOUNTER — Ambulatory Visit
Admission: RE | Admit: 2017-05-27 | Discharge: 2017-05-27 | Disposition: A | Discharge status: Home / Self Care | Payer: PPO | Source: Ambulatory Visit | Attending: Radiation Oncology | Admitting: Radiation Oncology

## 2017-05-27 ENCOUNTER — Ambulatory Visit: Payer: PPO

## 2017-05-27 DIAGNOSIS — C61 Malignant neoplasm of prostate: Secondary | ICD-10-CM | POA: Diagnosis not present

## 2017-05-27 DIAGNOSIS — Z510 Encounter for antineoplastic radiation therapy: Secondary | ICD-10-CM | POA: Diagnosis not present

## 2017-05-28 ENCOUNTER — Ambulatory Visit
Admission: RE | Admit: 2017-05-28 | Discharge: 2017-05-28 | Disposition: A | Discharge status: Home / Self Care | Payer: PPO | Source: Ambulatory Visit | Attending: Radiation Oncology | Admitting: Radiation Oncology

## 2017-05-28 DIAGNOSIS — C61 Malignant neoplasm of prostate: Secondary | ICD-10-CM | POA: Diagnosis not present

## 2017-05-28 DIAGNOSIS — Z510 Encounter for antineoplastic radiation therapy: Secondary | ICD-10-CM | POA: Diagnosis not present

## 2017-06-09 ENCOUNTER — Encounter: Payer: Self-pay | Admitting: Radiation Oncology

## 2017-06-09 NOTE — Progress Notes (Signed)
  Radiation Oncology         [phone] ________________________________  Name: Michael Turner MRN: [id number]  Date: 06/09/2017  DOB: [birth year]  End of Treatment Note  Diagnosis: 72 y.o.gentleman with biochemical recurrence of Stage pT3aNxadenocarcinoma of the prostate, Gleason Score of 3+4 with tertiary Gleason pattern 5; with pretreatment PSA of 5.5, and current post treatment PSA of 0.16.  Indication for treatment: Curative       Radiation treatment dates:  04/06/17-05/28/17  Site/dose: Prostate fossa/ 68.4 Gy in 38 fractions  Beams/energy: IMRT  / 6X  Narrative: The patient tolerated radiation treatment relatively well. During treatment the patient complained of mild fatigue, occasional leakage, and nocturia x 2.  He denied gross hematuria, dysuria, weak stream or incomplete emptying.  He did report some spotty rectal bleeding intermittently but felt this was secondary to his chronic hemorrhoids.  He reported gas and bloating but denied diarrhea or abdominal pain.  Plan: The patient has completed radiation treatment. The patient will return to radiation oncology clinic for routine followup in one month. I advised him to call or return sooner if he has any questions or concerns related to his recovery or treatment. ________________________________  Sheral Apley. Tammi Klippel, M.D.   This document serves as a record of services personally performed by Tyler Pita, MD. It was created on his behalf by Bethann Humble, a trained medical scribe. The creation of this record is based on the scribe's personal observations and the provider's statements to them. This document has been checked and approved by the attending provider.

## 2017-06-29 DIAGNOSIS — M25511 Pain in right shoulder: Secondary | ICD-10-CM | POA: Diagnosis not present

## 2017-06-30 ENCOUNTER — Ambulatory Visit
Admission: RE | Admit: 2017-06-30 | Discharge: 2017-06-30 | Disposition: A | Discharge status: Home / Self Care | Payer: PPO | Source: Ambulatory Visit | Attending: Urology | Admitting: Urology

## 2017-06-30 ENCOUNTER — Encounter: Payer: Self-pay | Admitting: *Deleted

## 2017-06-30 ENCOUNTER — Other Ambulatory Visit: Payer: Self-pay | Admitting: Surgery

## 2017-06-30 DIAGNOSIS — C61 Malignant neoplasm of prostate: Secondary | ICD-10-CM

## 2017-06-30 DIAGNOSIS — K432 Incisional hernia without obstruction or gangrene: Secondary | ICD-10-CM

## 2017-06-30 NOTE — Progress Notes (Addendum)
[place] left a message on his voicemail to call about his one month follow up appointment with Freeman Caldron, PA-C for Dr. Tammi Klippel after completing his radiation.  He will need to re-schedule if he does not come today call [phone] and ask for nurse Tonie. [place] PA-C made aware of the above situation .

## 2017-07-06 DIAGNOSIS — S46011D Strain of muscle(s) and tendon(s) of the rotator cuff of right shoulder, subsequent encounter: Secondary | ICD-10-CM | POA: Diagnosis not present

## 2017-07-06 DIAGNOSIS — M25511 Pain in right shoulder: Secondary | ICD-10-CM | POA: Diagnosis not present

## 2017-07-06 DIAGNOSIS — M25611 Stiffness of right shoulder, not elsewhere classified: Secondary | ICD-10-CM | POA: Diagnosis not present

## 2017-07-06 DIAGNOSIS — M6281 Muscle weakness (generalized): Secondary | ICD-10-CM | POA: Diagnosis not present

## 2017-07-09 ENCOUNTER — Ambulatory Visit
Admission: RE | Admit: 2017-07-09 | Discharge: 2017-07-09 | Disposition: A | Discharge status: Home / Self Care | Payer: PPO | Source: Ambulatory Visit | Attending: Surgery | Admitting: Surgery

## 2017-07-09 DIAGNOSIS — K8020 Calculus of gallbladder without cholecystitis without obstruction: Secondary | ICD-10-CM | POA: Diagnosis not present

## 2017-07-09 DIAGNOSIS — K432 Incisional hernia without obstruction or gangrene: Secondary | ICD-10-CM

## 2017-07-09 MED ORDER — IOPAMIDOL (ISOVUE-300) INJECTION 61%
100.0000 mL | Freq: Once | INTRAVENOUS | Status: AC | PRN
  Administered 2017-07-09: 100 mL via INTRAVENOUS

## 2017-07-16 ENCOUNTER — Ambulatory Visit: Payer: Self-pay | Admitting: Surgery

## 2017-07-28 DIAGNOSIS — C61 Malignant neoplasm of prostate: Secondary | ICD-10-CM | POA: Diagnosis not present

## 2017-08-07 ENCOUNTER — Encounter (HOSPITAL_BASED_OUTPATIENT_CLINIC_OR_DEPARTMENT_OTHER): Payer: Self-pay | Admitting: *Deleted

## 2017-08-07 ENCOUNTER — Other Ambulatory Visit: Payer: Self-pay

## 2017-08-07 NOTE — Progress Notes (Signed)
Pt. States that he has just recently finished 35 radiation treatments this past March for prostate cancer.

## 2017-08-12 ENCOUNTER — Ambulatory Visit (HOSPITAL_BASED_OUTPATIENT_CLINIC_OR_DEPARTMENT_OTHER): Payer: PPO | Admitting: Certified Registered"

## 2017-08-12 ENCOUNTER — Other Ambulatory Visit: Payer: Self-pay

## 2017-08-12 ENCOUNTER — Encounter (HOSPITAL_BASED_OUTPATIENT_CLINIC_OR_DEPARTMENT_OTHER): Payer: Self-pay | Admitting: Certified Registered"

## 2017-08-12 ENCOUNTER — Ambulatory Visit (HOSPITAL_BASED_OUTPATIENT_CLINIC_OR_DEPARTMENT_OTHER)
Admission: RE | Admit: 2017-08-12 | Discharge: 2017-08-12 | Disposition: A | Discharge status: Home / Self Care | Payer: PPO | Source: Ambulatory Visit | Attending: Surgery | Admitting: Surgery

## 2017-08-12 ENCOUNTER — Encounter (HOSPITAL_BASED_OUTPATIENT_CLINIC_OR_DEPARTMENT_OTHER)
Admission: RE | Disposition: A | Discharge status: Home / Self Care | Payer: Self-pay | Source: Ambulatory Visit | Attending: Surgery

## 2017-08-12 DIAGNOSIS — Z79899 Other long term (current) drug therapy: Secondary | ICD-10-CM | POA: Insufficient documentation

## 2017-08-12 DIAGNOSIS — K219 Gastro-esophageal reflux disease without esophagitis: Secondary | ICD-10-CM | POA: Insufficient documentation

## 2017-08-12 DIAGNOSIS — E785 Hyperlipidemia, unspecified: Secondary | ICD-10-CM | POA: Insufficient documentation

## 2017-08-12 DIAGNOSIS — K439 Ventral hernia without obstruction or gangrene: Secondary | ICD-10-CM | POA: Insufficient documentation

## 2017-08-12 DIAGNOSIS — Z9079 Acquired absence of other genital organ(s): Secondary | ICD-10-CM | POA: Diagnosis not present

## 2017-08-12 DIAGNOSIS — Z923 Personal history of irradiation: Secondary | ICD-10-CM | POA: Insufficient documentation

## 2017-08-12 DIAGNOSIS — K432 Incisional hernia without obstruction or gangrene: Secondary | ICD-10-CM | POA: Diagnosis not present

## 2017-08-12 DIAGNOSIS — Z8546 Personal history of malignant neoplasm of prostate: Secondary | ICD-10-CM | POA: Diagnosis not present

## 2017-08-12 HISTORY — PX: INSERTION OF MESH: SHX5868

## 2017-08-12 HISTORY — PX: PARASTOMAL HERNIA REPAIR: SHX2162

## 2017-08-12 HISTORY — DX: Multiple fractures of pelvis without disruption of pelvic ring, initial encounter for closed fracture: S32.82XA

## 2017-08-12 SURGERY — REPAIR, HERNIA, PARASTOMAL
Anesthesia: General | Site: Abdomen | Laterality: Right

## 2017-08-12 MED ORDER — DEXAMETHASONE SODIUM PHOSPHATE 4 MG/ML IJ SOLN
INTRAMUSCULAR | Status: DC | PRN
  Administered 2017-08-12: 10 mg via INTRAVENOUS

## 2017-08-12 MED ORDER — LACTATED RINGERS IV SOLN
INTRAVENOUS | Status: DC
  Administered 2017-08-12 (×2): via INTRAVENOUS

## 2017-08-12 MED ORDER — FENTANYL CITRATE (PF) 100 MCG/2ML IJ SOLN: 25.0000 ug | INTRAMUSCULAR | Status: DC | PRN

## 2017-08-12 MED ORDER — SUGAMMADEX SODIUM 500 MG/5ML IV SOLN
INTRAVENOUS | Status: AC
  Filled 2017-08-12: qty 5

## 2017-08-12 MED ORDER — MIDAZOLAM HCL 2 MG/2ML IJ SOLN
INTRAMUSCULAR | Status: AC
  Filled 2017-08-12: qty 2

## 2017-08-12 MED ORDER — HYDROCODONE-ACETAMINOPHEN 5-325 MG PO TABS: 1.0000 | ORAL_TABLET | Freq: Four times a day (QID) | ORAL | 0 refills | Status: DC | PRN

## 2017-08-12 MED ORDER — FENTANYL CITRATE (PF) 100 MCG/2ML IJ SOLN
INTRAMUSCULAR | Status: AC
  Filled 2017-08-12: qty 2

## 2017-08-12 MED ORDER — ROCURONIUM BROMIDE 50 MG/5ML IV SOLN
INTRAVENOUS | Status: AC
  Filled 2017-08-12: qty 2

## 2017-08-12 MED ORDER — MIDAZOLAM HCL 2 MG/2ML IJ SOLN: 1.0000 mg | INTRAMUSCULAR | Status: DC | PRN

## 2017-08-12 MED ORDER — EPHEDRINE SULFATE 50 MG/ML IJ SOLN
INTRAMUSCULAR | Status: AC
  Filled 2017-08-12: qty 2

## 2017-08-12 MED ORDER — SUGAMMADEX SODIUM 500 MG/5ML IV SOLN
INTRAVENOUS | Status: DC | PRN
  Administered 2017-08-12: 200 mg via INTRAVENOUS

## 2017-08-12 MED ORDER — PHENYLEPHRINE 40 MCG/ML (10ML) SYRINGE FOR IV PUSH (FOR BLOOD PRESSURE SUPPORT)
PREFILLED_SYRINGE | INTRAVENOUS | Status: AC
  Filled 2017-08-12: qty 10

## 2017-08-12 MED ORDER — ACETAMINOPHEN 500 MG PO TABS
ORAL_TABLET | ORAL | Status: AC
  Filled 2017-08-12: qty 2

## 2017-08-12 MED ORDER — ROCURONIUM BROMIDE 100 MG/10ML IV SOLN
INTRAVENOUS | Status: DC | PRN
  Administered 2017-08-12: 30 mg via INTRAVENOUS

## 2017-08-12 MED ORDER — ACETAMINOPHEN 500 MG PO TABS
1000.0000 mg | ORAL_TABLET | ORAL | Status: AC
  Administered 2017-08-12: 1000 mg via ORAL

## 2017-08-12 MED ORDER — KETOROLAC TROMETHAMINE 30 MG/ML IJ SOLN
INTRAMUSCULAR | Status: DC | PRN
  Administered 2017-08-12: 15 mg via INTRAVENOUS

## 2017-08-12 MED ORDER — ONDANSETRON HCL 4 MG/2ML IJ SOLN
INTRAMUSCULAR | Status: DC | PRN
  Administered 2017-08-12: 4 mg via INTRAVENOUS

## 2017-08-12 MED ORDER — FENTANYL CITRATE (PF) 100 MCG/2ML IJ SOLN
50.0000 ug | INTRAMUSCULAR | Status: DC | PRN
  Administered 2017-08-12: 100 ug via INTRAVENOUS

## 2017-08-12 MED ORDER — PROPOFOL 10 MG/ML IV BOLUS
INTRAVENOUS | Status: DC | PRN
  Administered 2017-08-12: 150 mg via INTRAVENOUS

## 2017-08-12 MED ORDER — CEFAZOLIN SODIUM-DEXTROSE 2-4 GM/100ML-% IV SOLN
INTRAVENOUS | Status: AC
  Filled 2017-08-12: qty 100

## 2017-08-12 MED ORDER — KETOROLAC TROMETHAMINE 30 MG/ML IJ SOLN
INTRAMUSCULAR | Status: AC
Start: 2017-08-12 — End: ?
  Filled 2017-08-12: qty 1

## 2017-08-12 MED ORDER — LIDOCAINE HCL (CARDIAC) PF 100 MG/5ML IV SOSY
PREFILLED_SYRINGE | INTRAVENOUS | Status: DC | PRN
  Administered 2017-08-12: 60 mg via INTRAVENOUS

## 2017-08-12 MED ORDER — CEFAZOLIN SODIUM-DEXTROSE 2-4 GM/100ML-% IV SOLN
2.0000 g | INTRAVENOUS | Status: AC
  Administered 2017-08-12: 2 g via INTRAVENOUS

## 2017-08-12 MED ORDER — PROPOFOL 10 MG/ML IV BOLUS
INTRAVENOUS | Status: AC
  Filled 2017-08-12: qty 20

## 2017-08-12 MED ORDER — BUPIVACAINE-EPINEPHRINE 0.25% -1:200000 IJ SOLN
INTRAMUSCULAR | Status: DC | PRN
  Administered 2017-08-12: 20 mL

## 2017-08-12 MED ORDER — CHLORHEXIDINE GLUCONATE CLOTH 2 % EX PADS: 6.0000 | MEDICATED_PAD | Freq: Once | CUTANEOUS | Status: DC

## 2017-08-12 MED ORDER — ONDANSETRON HCL 4 MG/2ML IJ SOLN: 4.0000 mg | Freq: Once | INTRAMUSCULAR | Status: DC | PRN

## 2017-08-12 MED ORDER — SCOPOLAMINE 1 MG/3DAYS TD PT72: 1.0000 | MEDICATED_PATCH | Freq: Once | TRANSDERMAL | Status: DC | PRN

## 2017-08-12 SURGICAL SUPPLY — 55 items
BENZOIN TINCTURE PRP APPL 2/3 (GAUZE/BANDAGES/DRESSINGS) IMPLANT
BLADE CLIPPER SURG (BLADE) ×2 IMPLANT
BLADE HEX COATED 2.75 (ELECTRODE) ×2 IMPLANT
BLADE SURG 10 STRL SS (BLADE) ×2 IMPLANT
BLADE SURG 15 STRL LF DISP TIS (BLADE) ×1 IMPLANT
BLADE SURG 15 STRL SS (BLADE) ×1
CANISTER SUCT 1200ML W/VALVE (MISCELLANEOUS) ×2 IMPLANT
CHLORAPREP W/TINT 26ML (MISCELLANEOUS) ×2 IMPLANT
COVER BACK TABLE 60X90IN (DRAPES) ×2 IMPLANT
COVER MAYO STAND STRL (DRAPES) ×2 IMPLANT
DECANTER SPIKE VIAL GLASS SM (MISCELLANEOUS) ×2 IMPLANT
DRAPE LAPAROTOMY T 102X78X121 (DRAPES) ×2 IMPLANT
DRAPE UTILITY XL STRL (DRAPES) ×2 IMPLANT
DRSG TEGADERM 4X4.75 (GAUZE/BANDAGES/DRESSINGS) ×2 IMPLANT
ELECT REM PT RETURN 9FT ADLT (ELECTROSURGICAL) ×2
ELECTRODE REM PT RTRN 9FT ADLT (ELECTROSURGICAL) ×1 IMPLANT
GAUZE SPONGE 4X4 12PLY STRL LF (GAUZE/BANDAGES/DRESSINGS) ×4 IMPLANT
GLOVE BIO SURGEON STRL SZ7 (GLOVE) ×2 IMPLANT
GLOVE BIOGEL M STRL SZ7.5 (GLOVE) ×2 IMPLANT
GLOVE BIOGEL PI IND STRL 7.5 (GLOVE) ×1 IMPLANT
GLOVE BIOGEL PI IND STRL 8 (GLOVE) ×1 IMPLANT
GLOVE BIOGEL PI INDICATOR 7.5 (GLOVE) ×1
GLOVE BIOGEL PI INDICATOR 8 (GLOVE) ×1
GOWN STRL REUS W/ TWL LRG LVL3 (GOWN DISPOSABLE) ×1 IMPLANT
GOWN STRL REUS W/TWL LRG LVL3 (GOWN DISPOSABLE) ×1
MESH VENTRALEX ST 1-7/10 CRC S (Mesh General) ×2 IMPLANT
NEEDLE HYPO 25X1 1.5 SAFETY (NEEDLE) ×2 IMPLANT
NEEDLE SPNL 25GX3.5 QUINCKE BL (NEEDLE) IMPLANT
NS IRRIG 1000ML POUR BTL (IV SOLUTION) ×2 IMPLANT
PACK BASIN DAY SURGERY FS (CUSTOM PROCEDURE TRAY) ×2 IMPLANT
PENCIL BUTTON HOLSTER BLD 10FT (ELECTRODE) ×2 IMPLANT
SLEEVE SCD COMPRESS KNEE MED (MISCELLANEOUS) IMPLANT
SPONGE GAUZE 2X2 8PLY STRL LF (GAUZE/BANDAGES/DRESSINGS) IMPLANT
SPONGE LAP 18X18 RF (DISPOSABLE) ×2 IMPLANT
SPONGE LAP 4X18 RFD (DISPOSABLE) ×2 IMPLANT
STAPLER VISISTAT 35W (STAPLE) IMPLANT
STRIP CLOSURE SKIN 1/2X4 (GAUZE/BANDAGES/DRESSINGS) IMPLANT
SUT MNCRL AB 4-0 PS2 18 (SUTURE) ×2 IMPLANT
SUT NOVA 0 T19/GS 22DT (SUTURE) ×2 IMPLANT
SUT NOVA NAB DX-16 0-1 5-0 T12 (SUTURE) IMPLANT
SUT NOVA NAB GS-21 1 T12 (SUTURE) ×2 IMPLANT
SUT PROLENE 0 CT 1 CR/8 (SUTURE) IMPLANT
SUT PROLENE 2 0 CT2 30 (SUTURE) IMPLANT
SUT SILK 2 0 TIES 17X18 (SUTURE)
SUT SILK 2-0 18XBRD TIE BLK (SUTURE) IMPLANT
SUT SURG 0 T 19/GS 22 1969 62 (SUTURE) IMPLANT
SUT VIC AB 0 SH 27 (SUTURE) ×2 IMPLANT
SUT VIC AB 3-0 SH 27 (SUTURE) ×1
SUT VIC AB 3-0 SH 27X BRD (SUTURE) ×1 IMPLANT
SYR BULB 3OZ (MISCELLANEOUS) IMPLANT
SYR CONTROL 10ML LL (SYRINGE) ×2 IMPLANT
TOWEL GREEN STERILE FF (TOWEL DISPOSABLE) ×2 IMPLANT
TOWEL OR NON WOVEN STRL DISP B (DISPOSABLE) IMPLANT
TUBE CONNECTING 20X1/4 (TUBING) ×2 IMPLANT
YANKAUER SUCT BULB TIP NO VENT (SUCTIONS) ×2 IMPLANT

## 2017-08-12 NOTE — H&P (Signed)
Michael Turner is an 72 y.o. male.   Chief Complaint: Ventral hernia HPI: This is a 72 yo male who is s/p robotic radical prostatectomy 10/12/13 at [facility].  Several months after surgery, he developed a bulge in the RLQ at one of his port sites.  The bulge has become larger and more uncomfortable.  He presents now for hernia repair.  He has completed radiation therapy.   Past Medical History:  Diagnosis Date  . Bladder neck obstruction 2015  . Cancer of prostate with intermediate recurrence risk (stage T2b-c or Gleason 7 or PSA 10-20) ([place]) 2015  . Elevated prostate specific antigen (PSA) 2015  . Hernia of anterior abdominal wall 2015  . Malignant tumor of prostate ([place]) 2015  . Multiple closed anterior-posterior compression fractures of pelvis ([place])   . Post-traumatic anterior urethral stricture 2015    Past Surgical History:  Procedure Laterality Date  . CYSTOSCOPY  01/05/2014  . INTERNAL URETHROTOMY  01/05/2014  . PROSTATE BIOPSY  06/24/2013   Dr. Nelma Rothman III  . ROBOT ASSISTED LAPAROSCOPIC RADICAL PROSTATECTOMY  10/11/2013   [facility]    Family History  Problem Relation Age of Onset  . Cancer Brother        younger brother/prostate ca/tx with combo chemo, adt, radiation  . Cancer Maternal Grandmother        bladder   Social History:  reports that he has never smoked. He has never used smokeless tobacco. He reports that he drinks about 1.2 - 1.8 oz of alcohol per week. He reports that he does not use drugs.  Allergies: No Known Allergies  Medications Prior to Admission  Medication Sig Dispense Refill  . atorvastatin (LIPITOR) 40 MG tablet Take 40 mg by mouth daily.    . Cholecalciferol (VITAMIN D-1000 MAX ST) 1000 units tablet Take by mouth.    . Multiple Vitamins-Minerals (CENTRUM SILVER 50+MEN PO) Take by mouth.    Marland Kitchen omeprazole (PRILOSEC) 2 mg/mL SUSP Take by mouth.      CLINICAL DATA:  History of ventral incisional hernia, prostate carcinoma  with prostatectomy in 2015  EXAM: CT ABDOMEN AND PELVIS WITH CONTRAST  TECHNIQUE: Multidetector CT imaging of the abdomen and pelvis was performed using the standard protocol following bolus administration of intravenous contrast.  CONTRAST:  19mL ISOVUE-300 IOPAMIDOL (ISOVUE-300) INJECTION 61%  Creatinine was obtained on site at [facility] at [address]  Results: Creatinine 1.2 mg/dL.  GFR equals 60.  COMPARISON:  None.  FINDINGS: Lower chest: Mild linear scarring is noted in the right lower lobe. No pneumonia or effusion is seen. There is a small hiatal hernia present.  Hepatobiliary: The liver enhances with no focal abnormality and no ductal dilatation is seen. There are small gallstones layering dependently within the gallbladder.  Pancreas: The pancreas is normal in size and the pancreatic duct is not dilated.  Spleen: The spleen is unremarkable.  Adrenals/Urinary Tract: The adrenal glands appear normal. The kidneys enhance with no calculus mass. On delayed images, pelvocaliceal systems are unremarkable. The ureters are normal in caliber. The urinary bladder is not distended and therefore cannot be evaluated.  Stomach/Bowel: The stomach is moderately distended with fluid with no abnormality noted. No distention of small bowel is seen. However, there is thickening of the mucosa of the terminal ileum with some stratification of layers. This could indicate chronic findings of inflammatory bowel disease such as Crohn's disease. Recommend clinical correlation. No present edema of small bowel mucosa  is seen.  Multiple rectosigmoid colon diverticula are noted. No diverticulitis is seen. There also few diverticula within the descending colon. No abnormality of the colon is noted. The appendix is not definitely seen.  Vascular/Lymphatic: The abdominal aorta is normal in caliber. No adenopathy is noted. Only small mesenteric lymph nodes  are present.  Reproductive: The prostate has by history been resected previously.  Other: No abdominal wall hernia is seen. No inguinal hernia is noted.  Musculoskeletal: On bone window images there may be sacroiliitis present which could be related to inflammatory bowel disease. Correlate clinically. The lumbar vertebrae are normal alignment. There are mild compression deformities of L1, L3 and L4 of uncertain age.  IMPRESSION: No abdominal wall hernia is seen.  1. However, there is thickening of the mucosa of the terminal ileum with stratification which can be seen with burned-out inflammatory bowel disease such as Crohn's disease. Correlate clinically. 2. Also there appears to be sacroiliitis which may be related to inflammatory bowel disease as well. 3. Small gallstones within the gallbladder. 4. Mild compression deformities of L1, L3, and L4 of uncertain age.   Electronically Signed   By: Ivar Drape M.D.   On: 07/09/2017 16:18 Review of Systems  Constitutional: Negative for weight loss.  HENT: Negative for ear discharge, ear pain, hearing loss and tinnitus.   Eyes: Negative for blurred vision, double vision, photophobia and pain.  Respiratory: Negative for cough, sputum production and shortness of breath.   Cardiovascular: Negative for chest pain.  Gastrointestinal: Positive for abdominal pain. Negative for nausea and vomiting.  Genitourinary: Negative for dysuria, flank pain, frequency and urgency.  Musculoskeletal: Negative for back pain, falls, joint pain, myalgias and neck pain.  Neurological: Negative for dizziness, tingling, sensory change, focal weakness, loss of consciousness and headaches.  Endo/Heme/Allergies: Does not bruise/bleed easily.  Psychiatric/Behavioral: Negative for depression, memory loss and substance abuse. The patient is not nervous/anxious.     Height 5' 9.5" (1.765 m), weight 82.6 kg (182 lb). Physical Exam  WDWN in NAD Eyes:   Pupils equal, round; sclera anicteric HENT:  Oral mucosa moist; good dentition  Neck:  No masses palpated, no thyromegaly Lungs:  CTA bilaterally; normal respiratory effort CV:  Regular rate and rhythm; no murmurs; extremities well-perfused with no edema Abd:  +bowel sounds, soft, non-tender, no palpable organomegaly; RLQ above the groin, 5 cm palpable bulge that enlarges with Valsalva and is reducible.  Just medial to ASIS Skin:  Warm, dry; no sign of jaundice Psychiatric - alert and oriented x 4; calm mood and affect  Assessment/Plan Ventral incisional hernia   Open ventral hernia repair with mesh. The surgical procedure has been discussed with the patient.  Potential risks, benefits, alternative treatments, and expected outcomes have been explained.  All of the patient's questions at this time have been answered.  The likelihood of reaching the patient's treatment goal is good.  The patient understand the proposed surgical procedure and wishes to proceed.  Maia Petties, MD 08/12/2017, 10:21 AM

## 2017-08-12 NOTE — Anesthesia Preprocedure Evaluation (Signed)
Anesthesia Evaluation  Patient identified by MRN, date of birth, ID band Patient awake    Reviewed: Allergy & Precautions, NPO status , Patient's Chart, lab work & pertinent test results  Airway Mallampati: II  TM Distance: >3 FB Neck ROM: Full    Dental no notable dental hx.    Pulmonary neg pulmonary ROS,    Pulmonary exam normal breath sounds clear to auscultation       Cardiovascular negative cardio ROS Normal cardiovascular exam Rhythm:Regular Rate:Normal     Neuro/Psych negative neurological ROS  negative psych ROS   GI/Hepatic Neg liver ROS, GERD  Medicated and Controlled,  Endo/Other  negative endocrine ROS  Renal/GU negative Renal ROS     Musculoskeletal negative musculoskeletal ROS (+)   Abdominal   Peds  Hematology HLD Prostate CA s/p radiation   Anesthesia Other Findings VENTRAL INCISIONAL HERNIA  Reproductive/Obstetrics                             Anesthesia Physical Anesthesia Plan  ASA: II  Anesthesia Plan: General   Post-op Pain Management:    Induction: Intravenous  PONV Risk Score and Plan: 2 and Ondansetron, Dexamethasone, Midazolam and Treatment may vary due to age or medical condition  Airway Management Planned: Oral ETT  Additional Equipment:   Intra-op Plan:   Post-operative Plan: Extubation in OR  Informed Consent: I have reviewed the patients History and Physical, chart, labs and discussed the procedure including the risks, benefits and alternatives for the proposed anesthesia with the patient or authorized representative who has indicated his/her understanding and acceptance.   Dental advisory given  Plan Discussed with: CRNA  Anesthesia Plan Comments:         Anesthesia Quick Evaluation

## 2017-08-12 NOTE — Discharge Instructions (Signed)
[facility], [place]  HERNIA REPAIR: POST OP INSTRUCTIONS  Always review your discharge instruction sheet given to you by the facility where your surgery was performed. IF YOU HAVE DISABILITY OR FAMILY LEAVE FORMS, YOU MUST BRING THEM TO THE OFFICE FOR PROCESSING.   DO NOT GIVE THEM TO YOUR DOCTOR.  1. A  prescription for pain medication may be given to you upon discharge.  Take your pain medication as prescribed, if needed.  If narcotic pain medicine is not needed, then you may take acetaminophen (Tylenol) or ibuprofen (Advil) as needed. 2. Take your usually prescribed medications unless otherwise directed. 3. If you need a refill on your pain medication, please contact your pharmacy.  They will contact our office to request authorization. Prescriptions will not be filled after 5 pm or on week-ends. 4. You should follow a light diet the first 24 hours after arrival home, such as soup and crackers, etc.  Be sure to include lots of fluids daily.  Resume your normal diet the day after surgery. 5. Most patients will experience some swelling and bruising around the incision.  Ice packs and reclining will help.  Swelling and bruising can take several days to resolve.  6. It is common to experience some constipation if taking pain medication after surgery.  Increasing fluid intake and taking a stool softener (such as Colace) will usually help or prevent this problem from occurring.  A mild laxative (Milk of Magnesia or Miralax) should be taken according to package directions if there are no bowel movements after 48 hours. 7. Unless discharge instructions indicate otherwise, you may remove your bandages 24-48 hours after surgery, and you may shower at that time.  You will have steri-strips (small skin tapes) in place directly over the incision.  These strips should be left on the skin for 7-10 days. 8. ACTIVITIES:  You may resume regular (light) daily activities beginning the next day--such as daily  self-care, walking, climbing stairs--gradually increasing activities as tolerated.  You may have sexual intercourse when it is comfortable.  Refrain from any heavy lifting or straining until approved by your doctor. a. You may drive when you are no longer taking prescription pain medication, you can comfortably wear a seatbelt, and you can safely maneuver your car and apply brakes. b. RETURN TO WORK:  2-3 weeks with light duty - no lifting over 15 lbs. 9. You should see your doctor in the office for a follow-up appointment approximately 2-3 weeks after your surgery.  Make sure that you call for this appointment within a day or two after you arrive home to insure a convenient appointment time. 10. OTHER INSTRUCTIONS:  __________________________________________________________________________________________________________________________________________________________________________________________  WHEN TO CALL YOUR DOCTOR: 1. Fever over 101.0 2. Inability to urinate 3. Nausea and/or vomiting 4. Extreme swelling or bruising 5. Continued bleeding from incision. 6. Increased pain, redness, or drainage from the incision  The clinic staff is available to answer your questions during regular business hours.  Please dont hesitate to call and ask to speak to one of the nurses for clinical concerns.  If you have a medical emergency, go to the nearest emergency room or call 911.  A surgeon from [facility] is always on call at the hospital   [address] [place], [place], [place]  [address] ?  [address], [place], [place]   [address] [phone]    FAX [phone] Web site: [email or web address]   Post Anesthesia Home Care Instructions  Activity: Get plenty of rest for the remainder of the day. A responsible individual must stay with you for 24 hours following the procedure.  For the next 24 hours, DO NOT: -Drive a car -Conservation officer, nature -Drink alcoholic beverages -Take any medication unless instructed by your physician -Make any legal decisions or sign important papers.  Meals: Start with liquid foods such as gelatin or soup. Progress to regular foods as tolerated. Avoid greasy, spicy, heavy foods. If nausea and/or vomiting occur, drink only clear liquids until the nausea and/or vomiting subsides. Call your physician if vomiting continues.  Special Instructions/Symptoms: Your throat may feel dry or sore from the anesthesia or the breathing tube placed in your throat during surgery. If this causes discomfort, gargle with warm salt water. The discomfort should disappear within 24 hours.  If you had a scopolamine patch placed behind your ear for the management of post- operative nausea and/or vomiting:  1. The medication in the patch is effective for 72 hours, after which it should be removed.  Wrap patch in a tissue and discard in the trash. Wash hands thoroughly with soap and water. 2. You may remove the patch earlier than 72 hours if you experience unpleasant side effects which may include dry mouth, dizziness or visual disturbances. 3. Avoid touching the patch. Wash your hands with soap and water after contact with the patch.

## 2017-08-12 NOTE — Transfer of Care (Signed)
Immediate Anesthesia Transfer of Care Note  Patient: Michael Turner  Procedure(s) Performed: VENTRAL INCISIONAL HERNIA REPAIR WITH MESH (Right Abdomen) INSERTION OF MESH (Right Abdomen)  Patient Location: PACU  Anesthesia Type:General  Level of Consciousness: awake and sedated  Airway & Oxygen Therapy: Patient Spontanous Breathing and Patient connected to face mask oxygen  Post-op Assessment: Report given to RN and Post -op Vital signs reviewed and stable  Post vital signs: Reviewed and stable  Last Vitals:  Vitals Value Taken Time  BP 139/72 08/12/2017 12:37 PM  Temp    Pulse 64 08/12/2017 12:39 PM  Resp 15 08/12/2017 12:39 PM  SpO2 100 % 08/12/2017 12:39 PM  Vitals shown include unvalidated device data.  Last Pain:  Vitals:   08/12/17 1040  TempSrc: Oral  PainSc: 0-No pain         Complications: No apparent anesthesia complications

## 2017-08-12 NOTE — Op Note (Signed)
Preop diagnosis: Right lower quadrant ventral hernia Postop diagnosis: Right lower quadrant Spigelian hernia Procedure performed: Ventral hernia repair with mesh (underlay with small ventral Lex mesh) Surgeon:Michael Turner Anesthesia: General endotracheal Indications: This is a 72 year old male who is status post robotic prostatectomy 10/12/2013 at [facility].  He has developed a bulge in his right lower quadrant near 1 of his port sites.  This was felt to likely represent a small incisional hernia.  CT scan was not helpful.  He presents now for hernia repair.  Description of procedure: Patient is brought to the operating room and placed in supine position on the operating room table.  After an adequate level of general anesthesia was obtained patient's right lower abdomen was prepped with ChloraPrep and draped in sterile fashion.  A timeout was taken to ensure the proper patient and proper procedure.  We had previously marked the area of the palpable hernia.  I made a transverse incision across this area.  We dissected down to the subcutaneous tissues with cautery.  I encountered the external oblique fascia which seem to be intact.  However there seem to be a bulge behind this area.  I open the external oblique fascia along the direction of its fibers.  We encountered a hernia sac deep to the external oblique fascia.  We mobilized this from the surrounding structures.  The hernia defect seems to be only about 1.5 cm across.  We reduce the hernia sac.  I placed a small ventral X mesh in the preperitoneal space.  This was secured to the undersurface of the fascia with 0 Novafil sutures.  The fascia was reapproximated over the mesh with #1 Novafil suture.  The external oblique fascia was reapproximated with 0 Vicryl.  The subcutaneous tissues were closed with 3-0 Vicryl.  4-0 Monocryl was used to close the skin.  Benzoin Steri-Strips were applied.  The patient was then extubated and brought to the  recovery room in stable condition.  All sponge, instrument, and needle counts are correct.  Michael Turner. Michael Dover, MD, [facility]  08/12/2017 12:35 PM

## 2017-08-12 NOTE — Anesthesia Procedure Notes (Signed)
Procedure Name: Intubation Date/Time: 08/12/2017 11:46 AM Performed by: Signe Colt, CRNA Pre-anesthesia Checklist: Patient identified, Emergency Drugs available, Suction available and Patient being monitored Patient Re-evaluated:Patient Re-evaluated prior to induction Oxygen Delivery Method: Circle system utilized Preoxygenation: Pre-oxygenation with 100% oxygen Induction Type: IV induction Ventilation: Mask ventilation without difficulty Laryngoscope Size: Mac and 3 Grade View: Grade I Tube type: Oral Tube size: 7.0 mm Number of attempts: 1 Airway Equipment and Method: Stylet and Oral airway Placement Confirmation: ETT inserted through vocal cords under direct vision,  positive ETCO2 and breath sounds checked- equal and bilateral Secured at: 21 cm Tube secured with: Tape Dental Injury: Teeth and Oropharynx as per pre-operative assessment

## 2017-08-12 NOTE — Anesthesia Postprocedure Evaluation (Signed)
Anesthesia Post Note  Patient: DAELEN BELVEDERE  Procedure(s) Performed: VENTRAL INCISIONAL HERNIA REPAIR WITH MESH (Right Abdomen) INSERTION OF MESH (Right Abdomen)     Patient location during evaluation: PACU Anesthesia Type: General Level of consciousness: awake and alert Pain management: pain level controlled Vital Signs Assessment: post-procedure vital signs reviewed and stable Respiratory status: spontaneous breathing, nonlabored ventilation, respiratory function stable and patient connected to nasal cannula oxygen Cardiovascular status: blood pressure returned to baseline and stable Postop Assessment: no apparent nausea or vomiting Anesthetic complications: no    Last Vitals:  Vitals:   08/12/17 1311 08/12/17 1348  BP:  (!) 144/67  Pulse: (!) 53 (!) 56  Resp: 11 18  Temp:  37 C  SpO2: 100% 98%    Last Pain:  Vitals:   08/12/17 1348  TempSrc:   PainSc: 0-No pain                 Ryan P Ellender

## 2017-08-13 ENCOUNTER — Encounter (HOSPITAL_BASED_OUTPATIENT_CLINIC_OR_DEPARTMENT_OTHER): Payer: Self-pay | Admitting: Surgery

## 2017-11-27 DIAGNOSIS — R198 Other specified symptoms and signs involving the digestive system and abdomen: Secondary | ICD-10-CM | POA: Diagnosis not present

## 2017-11-27 DIAGNOSIS — K627 Radiation proctitis: Secondary | ICD-10-CM | POA: Diagnosis not present

## 2017-11-27 DIAGNOSIS — Z86010 Personal history of colonic polyps: Secondary | ICD-10-CM | POA: Diagnosis not present

## 2017-11-27 DIAGNOSIS — K625 Hemorrhage of anus and rectum: Secondary | ICD-10-CM | POA: Diagnosis not present

## 2018-01-26 DIAGNOSIS — Z86010 Personal history of colonic polyps: Secondary | ICD-10-CM | POA: Diagnosis not present

## 2018-01-26 DIAGNOSIS — D126 Benign neoplasm of colon, unspecified: Secondary | ICD-10-CM | POA: Diagnosis not present

## 2018-02-05 DIAGNOSIS — D485 Neoplasm of uncertain behavior of skin: Secondary | ICD-10-CM | POA: Diagnosis not present

## 2018-02-05 DIAGNOSIS — L821 Other seborrheic keratosis: Secondary | ICD-10-CM | POA: Diagnosis not present

## 2018-02-05 DIAGNOSIS — L300 Nummular dermatitis: Secondary | ICD-10-CM | POA: Diagnosis not present

## 2018-02-05 DIAGNOSIS — L812 Freckles: Secondary | ICD-10-CM | POA: Diagnosis not present

## 2018-02-05 DIAGNOSIS — D1801 Hemangioma of skin and subcutaneous tissue: Secondary | ICD-10-CM | POA: Diagnosis not present

## 2018-02-05 DIAGNOSIS — L570 Actinic keratosis: Secondary | ICD-10-CM | POA: Diagnosis not present

## 2018-02-18 DIAGNOSIS — E7849 Other hyperlipidemia: Secondary | ICD-10-CM | POA: Diagnosis not present

## 2018-02-18 DIAGNOSIS — M859 Disorder of bone density and structure, unspecified: Secondary | ICD-10-CM | POA: Diagnosis not present

## 2018-02-18 DIAGNOSIS — Z125 Encounter for screening for malignant neoplasm of prostate: Secondary | ICD-10-CM | POA: Diagnosis not present

## 2018-02-18 DIAGNOSIS — R7301 Impaired fasting glucose: Secondary | ICD-10-CM | POA: Diagnosis not present

## 2018-02-18 DIAGNOSIS — R82998 Other abnormal findings in urine: Secondary | ICD-10-CM | POA: Diagnosis not present

## 2018-02-23 DIAGNOSIS — R011 Cardiac murmur, unspecified: Secondary | ICD-10-CM | POA: Diagnosis not present

## 2018-02-23 DIAGNOSIS — Z1212 Encounter for screening for malignant neoplasm of rectum: Secondary | ICD-10-CM | POA: Diagnosis not present

## 2018-02-23 DIAGNOSIS — Z6827 Body mass index (BMI) 27.0-27.9, adult: Secondary | ICD-10-CM | POA: Diagnosis not present

## 2018-02-23 DIAGNOSIS — Z0000 Encounter for general adult medical examination without abnormal findings: Secondary | ICD-10-CM | POA: Diagnosis not present

## 2018-02-23 DIAGNOSIS — K439 Ventral hernia without obstruction or gangrene: Secondary | ICD-10-CM | POA: Diagnosis not present

## 2018-02-23 DIAGNOSIS — M25551 Pain in right hip: Secondary | ICD-10-CM | POA: Diagnosis not present

## 2018-02-23 DIAGNOSIS — R0683 Snoring: Secondary | ICD-10-CM | POA: Diagnosis not present

## 2018-02-23 DIAGNOSIS — Z1389 Encounter for screening for other disorder: Secondary | ICD-10-CM | POA: Diagnosis not present

## 2018-02-23 DIAGNOSIS — C61 Malignant neoplasm of prostate: Secondary | ICD-10-CM | POA: Diagnosis not present

## 2018-02-23 DIAGNOSIS — M79646 Pain in unspecified finger(s): Secondary | ICD-10-CM | POA: Diagnosis not present

## 2018-02-23 DIAGNOSIS — D7589 Other specified diseases of blood and blood-forming organs: Secondary | ICD-10-CM | POA: Diagnosis not present

## 2018-02-23 DIAGNOSIS — M5489 Other dorsalgia: Secondary | ICD-10-CM | POA: Diagnosis not present

## 2018-02-23 DIAGNOSIS — N182 Chronic kidney disease, stage 2 (mild): Secondary | ICD-10-CM | POA: Diagnosis not present

## 2018-04-02 DIAGNOSIS — H524 Presbyopia: Secondary | ICD-10-CM | POA: Diagnosis not present

## 2018-04-02 DIAGNOSIS — H5213 Myopia, bilateral: Secondary | ICD-10-CM | POA: Diagnosis not present

## 2018-04-02 DIAGNOSIS — H43393 Other vitreous opacities, bilateral: Secondary | ICD-10-CM | POA: Diagnosis not present

## 2018-04-02 DIAGNOSIS — H52223 Regular astigmatism, bilateral: Secondary | ICD-10-CM | POA: Diagnosis not present

## 2018-04-02 DIAGNOSIS — H25813 Combined forms of age-related cataract, bilateral: Secondary | ICD-10-CM | POA: Diagnosis not present

## 2018-04-08 DIAGNOSIS — R030 Elevated blood-pressure reading, without diagnosis of hypertension: Secondary | ICD-10-CM | POA: Diagnosis not present

## 2018-04-09 DIAGNOSIS — Z0100 Encounter for examination of eyes and vision without abnormal findings: Secondary | ICD-10-CM | POA: Diagnosis not present

## 2018-04-14 DIAGNOSIS — R030 Elevated blood-pressure reading, without diagnosis of hypertension: Secondary | ICD-10-CM | POA: Diagnosis not present

## 2018-09-08 DIAGNOSIS — R69 Illness, unspecified: Secondary | ICD-10-CM | POA: Diagnosis not present

## 2018-11-05 DIAGNOSIS — Z23 Encounter for immunization: Secondary | ICD-10-CM | POA: Diagnosis not present

## 2019-01-24 DIAGNOSIS — R69 Illness, unspecified: Secondary | ICD-10-CM | POA: Diagnosis not present

## 2019-02-15 DIAGNOSIS — D225 Melanocytic nevi of trunk: Secondary | ICD-10-CM | POA: Diagnosis not present

## 2019-02-15 DIAGNOSIS — D1801 Hemangioma of skin and subcutaneous tissue: Secondary | ICD-10-CM | POA: Diagnosis not present

## 2019-02-15 DIAGNOSIS — L821 Other seborrheic keratosis: Secondary | ICD-10-CM | POA: Diagnosis not present

## 2019-02-15 DIAGNOSIS — L570 Actinic keratosis: Secondary | ICD-10-CM | POA: Diagnosis not present

## 2019-03-31 DIAGNOSIS — E7849 Other hyperlipidemia: Secondary | ICD-10-CM | POA: Diagnosis not present

## 2019-03-31 DIAGNOSIS — R7301 Impaired fasting glucose: Secondary | ICD-10-CM | POA: Diagnosis not present

## 2019-03-31 DIAGNOSIS — M859 Disorder of bone density and structure, unspecified: Secondary | ICD-10-CM | POA: Diagnosis not present

## 2019-03-31 DIAGNOSIS — Z125 Encounter for screening for malignant neoplasm of prostate: Secondary | ICD-10-CM | POA: Diagnosis not present

## 2019-03-31 DIAGNOSIS — R82998 Other abnormal findings in urine: Secondary | ICD-10-CM | POA: Diagnosis not present

## 2019-03-31 DIAGNOSIS — I10 Essential (primary) hypertension: Secondary | ICD-10-CM | POA: Diagnosis not present

## 2019-04-01 DIAGNOSIS — Z1331 Encounter for screening for depression: Secondary | ICD-10-CM | POA: Diagnosis not present

## 2019-04-01 DIAGNOSIS — R69 Illness, unspecified: Secondary | ICD-10-CM | POA: Diagnosis not present

## 2019-04-01 DIAGNOSIS — I10 Essential (primary) hypertension: Secondary | ICD-10-CM | POA: Diagnosis not present

## 2019-04-01 DIAGNOSIS — M25551 Pain in right hip: Secondary | ICD-10-CM | POA: Diagnosis not present

## 2019-04-01 DIAGNOSIS — K439 Ventral hernia without obstruction or gangrene: Secondary | ICD-10-CM | POA: Diagnosis not present

## 2019-04-01 DIAGNOSIS — N182 Chronic kidney disease, stage 2 (mild): Secondary | ICD-10-CM | POA: Diagnosis not present

## 2019-04-01 DIAGNOSIS — E785 Hyperlipidemia, unspecified: Secondary | ICD-10-CM | POA: Diagnosis not present

## 2019-04-01 DIAGNOSIS — D7589 Other specified diseases of blood and blood-forming organs: Secondary | ICD-10-CM | POA: Diagnosis not present

## 2019-04-01 DIAGNOSIS — R011 Cardiac murmur, unspecified: Secondary | ICD-10-CM | POA: Diagnosis not present

## 2019-04-01 DIAGNOSIS — C61 Malignant neoplasm of prostate: Secondary | ICD-10-CM | POA: Diagnosis not present

## 2019-04-01 DIAGNOSIS — Z0000 Encounter for general adult medical examination without abnormal findings: Secondary | ICD-10-CM | POA: Diagnosis not present

## 2019-04-05 DIAGNOSIS — H5213 Myopia, bilateral: Secondary | ICD-10-CM | POA: Diagnosis not present

## 2019-04-07 ENCOUNTER — Ambulatory Visit: Payer: Medicare HMO | Attending: Internal Medicine

## 2019-04-07 DIAGNOSIS — Z23 Encounter for immunization: Secondary | ICD-10-CM | POA: Insufficient documentation

## 2019-04-07 NOTE — Progress Notes (Signed)
   Covid-19 Vaccination Clinic  Name:  Michael Turner    MRN: [id number] DOB: Feb 14, 1946  04/07/2019  Mr. Villalpando was observed post Covid-19 immunization for 15 minutes without incidence. He was provided with Vaccine Information Sheet and instruction to access the V-Safe system.   Mr. Salvino was instructed to call 911 with any severe reactions post vaccine: Marland Kitchen Difficulty breathing  . Swelling of your face and throat  . A fast heartbeat  . A bad rash all over your body  . Dizziness and weakness    Immunizations Administered    Name Date Dose VIS Date Route   Pfizer COVID-19 Vaccine 04/07/2019 11:13 AM 0.3 mL 02/25/2019 Intramuscular   Manufacturer: [place]   Lot: [id number]   [place]: [id number]

## 2019-04-12 DIAGNOSIS — K921 Melena: Secondary | ICD-10-CM | POA: Diagnosis not present

## 2019-04-25 DIAGNOSIS — Z1212 Encounter for screening for malignant neoplasm of rectum: Secondary | ICD-10-CM | POA: Diagnosis not present

## 2019-04-28 ENCOUNTER — Ambulatory Visit: Payer: Medicare HMO | Attending: Internal Medicine

## 2019-04-28 DIAGNOSIS — Z23 Encounter for immunization: Secondary | ICD-10-CM | POA: Insufficient documentation

## 2019-04-28 NOTE — Progress Notes (Signed)
   Covid-19 Vaccination Clinic  Name:  Michael Turner    MRN: [id number] DOB: April 16, 1945  04/28/2019  [name] was observed post Covid-19 immunization for 15 minutes without incidence. He was provided with Vaccine Information Sheet and instruction to access the V-Safe system.   [name] was instructed to call 911 with any severe reactions post vaccine: Marland Kitchen Difficulty breathing  . Swelling of your face and throat  . A fast heartbeat  . A bad rash all over your body  . Dizziness and weakness    Immunizations Administered    Name Date Dose VIS Date Route   Pfizer COVID-19 Vaccine 04/28/2019 12:30 PM 0.3 mL 02/25/2019 Intramuscular   Manufacturer: [place]   Lot: [id number]   [place]: [id number]

## 2019-05-05 ENCOUNTER — Ambulatory Visit: Payer: PPO

## 2019-05-15 ENCOUNTER — Ambulatory Visit: Payer: PPO

## 2019-05-31 DIAGNOSIS — M8589 Other specified disorders of bone density and structure, multiple sites: Secondary | ICD-10-CM | POA: Diagnosis not present

## 2019-06-22 DIAGNOSIS — R69 Illness, unspecified: Secondary | ICD-10-CM | POA: Diagnosis not present

## 2019-12-13 ENCOUNTER — Ambulatory Visit: Payer: Medicare HMO | Attending: Internal Medicine

## 2019-12-13 DIAGNOSIS — Z23 Encounter for immunization: Secondary | ICD-10-CM

## 2019-12-13 NOTE — Progress Notes (Signed)
   Covid-19 Vaccination Clinic  Name:  Michael Turner    MRN: [id number] DOB: June 03, 1945  12/13/2019  [name] was observed post Covid-19 immunization for 15 minutes without incident. He was provided with Vaccine Information Sheet and instruction to access the V-Safe system.   [name] was instructed to call 911 with any severe reactions post vaccine: Marland Kitchen Difficulty breathing  . Swelling of face and throat  . A fast heartbeat  . A bad rash all over body  . Dizziness and weakness

## 2020-01-04 DIAGNOSIS — R69 Illness, unspecified: Secondary | ICD-10-CM | POA: Diagnosis not present

## 2020-01-07 DIAGNOSIS — Z23 Encounter for immunization: Secondary | ICD-10-CM | POA: Diagnosis not present

## 2020-02-20 DIAGNOSIS — R2681 Unsteadiness on feet: Secondary | ICD-10-CM | POA: Diagnosis not present

## 2020-02-20 DIAGNOSIS — K219 Gastro-esophageal reflux disease without esophagitis: Secondary | ICD-10-CM | POA: Diagnosis not present

## 2020-02-20 DIAGNOSIS — R7301 Impaired fasting glucose: Secondary | ICD-10-CM | POA: Diagnosis not present

## 2020-02-20 DIAGNOSIS — C61 Malignant neoplasm of prostate: Secondary | ICD-10-CM | POA: Diagnosis not present

## 2020-02-20 DIAGNOSIS — I10 Essential (primary) hypertension: Secondary | ICD-10-CM | POA: Diagnosis not present

## 2020-04-11 DIAGNOSIS — L570 Actinic keratosis: Secondary | ICD-10-CM | POA: Diagnosis not present

## 2020-04-11 DIAGNOSIS — L812 Freckles: Secondary | ICD-10-CM | POA: Diagnosis not present

## 2020-04-11 DIAGNOSIS — L218 Other seborrheic dermatitis: Secondary | ICD-10-CM | POA: Diagnosis not present

## 2020-04-11 DIAGNOSIS — L821 Other seborrheic keratosis: Secondary | ICD-10-CM | POA: Diagnosis not present

## 2020-04-11 DIAGNOSIS — D225 Melanocytic nevi of trunk: Secondary | ICD-10-CM | POA: Diagnosis not present

## 2020-04-11 DIAGNOSIS — L820 Inflamed seborrheic keratosis: Secondary | ICD-10-CM | POA: Diagnosis not present

## 2020-04-11 DIAGNOSIS — D1801 Hemangioma of skin and subcutaneous tissue: Secondary | ICD-10-CM | POA: Diagnosis not present

## 2020-04-19 DIAGNOSIS — M859 Disorder of bone density and structure, unspecified: Secondary | ICD-10-CM | POA: Diagnosis not present

## 2020-04-19 DIAGNOSIS — E785 Hyperlipidemia, unspecified: Secondary | ICD-10-CM | POA: Diagnosis not present

## 2020-04-19 DIAGNOSIS — R7301 Impaired fasting glucose: Secondary | ICD-10-CM | POA: Diagnosis not present

## 2020-04-19 DIAGNOSIS — Z125 Encounter for screening for malignant neoplasm of prostate: Secondary | ICD-10-CM | POA: Diagnosis not present

## 2020-04-24 DIAGNOSIS — H5213 Myopia, bilateral: Secondary | ICD-10-CM | POA: Diagnosis not present

## 2020-04-26 DIAGNOSIS — N182 Chronic kidney disease, stage 2 (mild): Secondary | ICD-10-CM | POA: Diagnosis not present

## 2020-04-26 DIAGNOSIS — R7301 Impaired fasting glucose: Secondary | ICD-10-CM | POA: Diagnosis not present

## 2020-04-26 DIAGNOSIS — Z0000 Encounter for general adult medical examination without abnormal findings: Secondary | ICD-10-CM | POA: Diagnosis not present

## 2020-04-26 DIAGNOSIS — Z1212 Encounter for screening for malignant neoplasm of rectum: Secondary | ICD-10-CM | POA: Diagnosis not present

## 2020-04-26 DIAGNOSIS — R300 Dysuria: Secondary | ICD-10-CM | POA: Diagnosis not present

## 2020-04-26 DIAGNOSIS — D7589 Other specified diseases of blood and blood-forming organs: Secondary | ICD-10-CM | POA: Diagnosis not present

## 2020-04-26 DIAGNOSIS — M79646 Pain in unspecified finger(s): Secondary | ICD-10-CM | POA: Diagnosis not present

## 2020-04-26 DIAGNOSIS — R030 Elevated blood-pressure reading, without diagnosis of hypertension: Secondary | ICD-10-CM | POA: Diagnosis not present

## 2020-04-26 DIAGNOSIS — M8589 Other specified disorders of bone density and structure, multiple sites: Secondary | ICD-10-CM | POA: Diagnosis not present

## 2020-04-26 DIAGNOSIS — E785 Hyperlipidemia, unspecified: Secondary | ICD-10-CM | POA: Diagnosis not present

## 2020-04-26 DIAGNOSIS — I10 Essential (primary) hypertension: Secondary | ICD-10-CM | POA: Diagnosis not present

## 2020-04-27 ENCOUNTER — Other Ambulatory Visit: Payer: Self-pay | Admitting: Internal Medicine

## 2020-04-27 DIAGNOSIS — E785 Hyperlipidemia, unspecified: Secondary | ICD-10-CM

## 2020-05-17 ENCOUNTER — Ambulatory Visit
Admission: RE | Admit: 2020-05-17 | Discharge: 2020-05-17 | Disposition: A | Discharge status: Home / Self Care | Payer: No Typology Code available for payment source | Source: Ambulatory Visit | Attending: Internal Medicine | Admitting: Internal Medicine

## 2020-05-17 DIAGNOSIS — E785 Hyperlipidemia, unspecified: Secondary | ICD-10-CM

## 2020-07-30 ENCOUNTER — Other Ambulatory Visit: Payer: Self-pay

## 2020-07-30 ENCOUNTER — Other Ambulatory Visit (HOSPITAL_BASED_OUTPATIENT_CLINIC_OR_DEPARTMENT_OTHER): Payer: Self-pay

## 2020-07-30 ENCOUNTER — Ambulatory Visit: Payer: Medicare HMO | Attending: Internal Medicine

## 2020-07-30 DIAGNOSIS — Z23 Encounter for immunization: Secondary | ICD-10-CM

## 2020-07-30 MED ORDER — PFIZER-BIONT COVID-19 VAC-TRIS 30 MCG/0.3ML IM SUSP
INTRAMUSCULAR | 0 refills | Status: DC
  Filled 2020-07-30: qty 0.3, 1d supply, fill #0

## 2020-07-30 NOTE — Progress Notes (Signed)
   Covid-19 Vaccination Clinic  Name:  Michael Turner    MRN: [id number] DOB: 01-Aug-1945  07/30/2020  [name] was observed post Covid-19 immunization for 15 minutes without incident. He was provided with Vaccine Information Sheet and instruction to access the V-Safe system.   [name] was instructed to call 911 with any severe reactions post vaccine: Marland Kitchen Difficulty breathing  . Swelling of face and throat  . A fast heartbeat  . A bad rash all over body  . Dizziness and weakness   Immunizations Administered    Name Date Dose VIS Date Route   PFIZER Comrnaty(Gray TOP) Covid-19 Vaccine 07/30/2020 10:24 AM 0.3 mL 02/23/2020 Intramuscular   Manufacturer: [employer]   Lot: [id number]   [place]: [phone]

## 2020-08-14 ENCOUNTER — Ambulatory Visit: Payer: Medicare HMO | Attending: Critical Care Medicine

## 2020-08-14 DIAGNOSIS — Z20822 Contact with and (suspected) exposure to covid-19: Secondary | ICD-10-CM | POA: Diagnosis not present

## 2020-08-15 DIAGNOSIS — Z1159 Encounter for screening for other viral diseases: Secondary | ICD-10-CM | POA: Diagnosis not present

## 2020-08-15 LAB — SARS-COV-2, NAA 2 DAY TAT

## 2020-08-15 LAB — NOVEL CORONAVIRUS, NAA: SARS-CoV-2, NAA: NOT DETECTED

## 2021-04-15 DIAGNOSIS — L308 Other specified dermatitis: Secondary | ICD-10-CM | POA: Diagnosis not present

## 2021-04-15 DIAGNOSIS — L821 Other seborrheic keratosis: Secondary | ICD-10-CM | POA: Diagnosis not present

## 2021-04-15 DIAGNOSIS — L570 Actinic keratosis: Secondary | ICD-10-CM | POA: Diagnosis not present

## 2021-04-15 DIAGNOSIS — D1801 Hemangioma of skin and subcutaneous tissue: Secondary | ICD-10-CM | POA: Diagnosis not present

## 2021-04-15 DIAGNOSIS — D692 Other nonthrombocytopenic purpura: Secondary | ICD-10-CM | POA: Diagnosis not present

## 2021-04-15 DIAGNOSIS — L812 Freckles: Secondary | ICD-10-CM | POA: Diagnosis not present

## 2021-04-19 DIAGNOSIS — N5231 Erectile dysfunction following radical prostatectomy: Secondary | ICD-10-CM | POA: Diagnosis not present

## 2021-04-19 DIAGNOSIS — R310 Gross hematuria: Secondary | ICD-10-CM | POA: Diagnosis not present

## 2021-04-19 DIAGNOSIS — C61 Malignant neoplasm of prostate: Secondary | ICD-10-CM | POA: Diagnosis not present

## 2021-05-06 DIAGNOSIS — I700 Atherosclerosis of aorta: Secondary | ICD-10-CM | POA: Diagnosis not present

## 2021-05-06 DIAGNOSIS — K8020 Calculus of gallbladder without cholecystitis without obstruction: Secondary | ICD-10-CM | POA: Diagnosis not present

## 2021-05-06 DIAGNOSIS — R310 Gross hematuria: Secondary | ICD-10-CM | POA: Diagnosis not present

## 2021-05-14 DIAGNOSIS — I10 Essential (primary) hypertension: Secondary | ICD-10-CM | POA: Diagnosis not present

## 2021-05-14 DIAGNOSIS — H5213 Myopia, bilateral: Secondary | ICD-10-CM | POA: Diagnosis not present

## 2021-05-30 DIAGNOSIS — E785 Hyperlipidemia, unspecified: Secondary | ICD-10-CM | POA: Diagnosis not present

## 2021-05-30 DIAGNOSIS — I10 Essential (primary) hypertension: Secondary | ICD-10-CM | POA: Diagnosis not present

## 2021-06-06 DIAGNOSIS — N182 Chronic kidney disease, stage 2 (mild): Secondary | ICD-10-CM | POA: Diagnosis not present

## 2021-06-06 DIAGNOSIS — I10 Essential (primary) hypertension: Secondary | ICD-10-CM | POA: Diagnosis not present

## 2021-06-06 DIAGNOSIS — R7301 Impaired fasting glucose: Secondary | ICD-10-CM | POA: Diagnosis not present

## 2021-06-06 DIAGNOSIS — E785 Hyperlipidemia, unspecified: Secondary | ICD-10-CM | POA: Diagnosis not present

## 2021-06-06 DIAGNOSIS — R9389 Abnormal findings on diagnostic imaging of other specified body structures: Secondary | ICD-10-CM | POA: Diagnosis not present

## 2021-06-06 DIAGNOSIS — Z0000 Encounter for general adult medical examination without abnormal findings: Secondary | ICD-10-CM | POA: Diagnosis not present

## 2021-06-06 DIAGNOSIS — I2510 Atherosclerotic heart disease of native coronary artery without angina pectoris: Secondary | ICD-10-CM | POA: Diagnosis not present

## 2021-06-06 DIAGNOSIS — K449 Diaphragmatic hernia without obstruction or gangrene: Secondary | ICD-10-CM | POA: Diagnosis not present

## 2021-06-06 DIAGNOSIS — I700 Atherosclerosis of aorta: Secondary | ICD-10-CM | POA: Diagnosis not present

## 2021-06-06 DIAGNOSIS — Z1212 Encounter for screening for malignant neoplasm of rectum: Secondary | ICD-10-CM | POA: Diagnosis not present

## 2021-06-06 DIAGNOSIS — M8580 Other specified disorders of bone density and structure, unspecified site: Secondary | ICD-10-CM | POA: Diagnosis not present

## 2021-06-06 DIAGNOSIS — K439 Ventral hernia without obstruction or gangrene: Secondary | ICD-10-CM | POA: Diagnosis not present

## 2021-06-06 DIAGNOSIS — R82998 Other abnormal findings in urine: Secondary | ICD-10-CM | POA: Diagnosis not present

## 2021-06-06 DIAGNOSIS — K219 Gastro-esophageal reflux disease without esophagitis: Secondary | ICD-10-CM | POA: Diagnosis not present

## 2021-07-18 DIAGNOSIS — Z86010 Personal history of colonic polyps: Secondary | ICD-10-CM | POA: Diagnosis not present

## 2021-07-18 DIAGNOSIS — R9389 Abnormal findings on diagnostic imaging of other specified body structures: Secondary | ICD-10-CM | POA: Diagnosis not present

## 2022-01-16 DIAGNOSIS — K219 Gastro-esophageal reflux disease without esophagitis: Secondary | ICD-10-CM | POA: Diagnosis not present

## 2022-01-16 DIAGNOSIS — C61 Malignant neoplasm of prostate: Secondary | ICD-10-CM | POA: Diagnosis not present

## 2022-01-16 DIAGNOSIS — R9389 Abnormal findings on diagnostic imaging of other specified body structures: Secondary | ICD-10-CM | POA: Diagnosis not present

## 2022-01-16 DIAGNOSIS — I2510 Atherosclerotic heart disease of native coronary artery without angina pectoris: Secondary | ICD-10-CM | POA: Diagnosis not present

## 2022-01-16 DIAGNOSIS — R2681 Unsteadiness on feet: Secondary | ICD-10-CM | POA: Diagnosis not present

## 2022-01-16 DIAGNOSIS — H9190 Unspecified hearing loss, unspecified ear: Secondary | ICD-10-CM | POA: Diagnosis not present

## 2022-01-16 DIAGNOSIS — R7301 Impaired fasting glucose: Secondary | ICD-10-CM | POA: Diagnosis not present

## 2022-01-16 DIAGNOSIS — M8580 Other specified disorders of bone density and structure, unspecified site: Secondary | ICD-10-CM | POA: Diagnosis not present

## 2022-01-16 DIAGNOSIS — E785 Hyperlipidemia, unspecified: Secondary | ICD-10-CM | POA: Diagnosis not present

## 2022-01-16 DIAGNOSIS — I700 Atherosclerosis of aorta: Secondary | ICD-10-CM | POA: Diagnosis not present

## 2022-01-16 DIAGNOSIS — I10 Essential (primary) hypertension: Secondary | ICD-10-CM | POA: Diagnosis not present

## 2022-04-01 DIAGNOSIS — D123 Benign neoplasm of transverse colon: Secondary | ICD-10-CM | POA: Diagnosis not present

## 2022-04-01 DIAGNOSIS — K5730 Diverticulosis of large intestine without perforation or abscess without bleeding: Secondary | ICD-10-CM | POA: Diagnosis not present

## 2022-04-01 DIAGNOSIS — D124 Benign neoplasm of descending colon: Secondary | ICD-10-CM | POA: Diagnosis not present

## 2022-04-01 DIAGNOSIS — K648 Other hemorrhoids: Secondary | ICD-10-CM | POA: Diagnosis not present

## 2022-04-01 DIAGNOSIS — Z09 Encounter for follow-up examination after completed treatment for conditions other than malignant neoplasm: Secondary | ICD-10-CM | POA: Diagnosis not present

## 2022-04-01 DIAGNOSIS — Z86010 Personal history of colonic polyps: Secondary | ICD-10-CM | POA: Diagnosis not present

## 2022-04-03 DIAGNOSIS — D124 Benign neoplasm of descending colon: Secondary | ICD-10-CM | POA: Diagnosis not present

## 2022-04-16 DIAGNOSIS — L308 Other specified dermatitis: Secondary | ICD-10-CM | POA: Diagnosis not present

## 2022-04-16 DIAGNOSIS — L570 Actinic keratosis: Secondary | ICD-10-CM | POA: Diagnosis not present

## 2022-04-16 DIAGNOSIS — D1801 Hemangioma of skin and subcutaneous tissue: Secondary | ICD-10-CM | POA: Diagnosis not present

## 2022-04-16 DIAGNOSIS — L812 Freckles: Secondary | ICD-10-CM | POA: Diagnosis not present

## 2022-04-16 DIAGNOSIS — D225 Melanocytic nevi of trunk: Secondary | ICD-10-CM | POA: Diagnosis not present

## 2022-04-16 DIAGNOSIS — L821 Other seborrheic keratosis: Secondary | ICD-10-CM | POA: Diagnosis not present

## 2022-04-25 IMAGING — CT CT CARDIAC CORONARY ARTERY CALCIUM SCORE
3 series · 14 of 20 positions shown, 16 images · non-contrast
Comparison: CT abdomen and pelvis 11/08/2017

CLINICAL DATA: 75-year-old white male with hyperlipidemia. History
of prostate cancer.

EXAM:
CT CARDIAC CORONARY ARTERY CALCIUM SCORE
TECHNIQUE: Non-contrast imaging through the heart was performed using
prospective ECG gating. Image post processing was performed on an
independent workstation, allowing for quantitative analysis of the
heart and coronary arteries. Note that this exam targets the heart
and the chest was not imaged in its entirety.

[Series 2: calcium scoring 2.00 qr36 bestdiast 71% hrt calciu · axial · 0.41mm/px · z∈[+1658,+1738]mm · 4 of 68 slices shown]
[im 14/68  vessel]
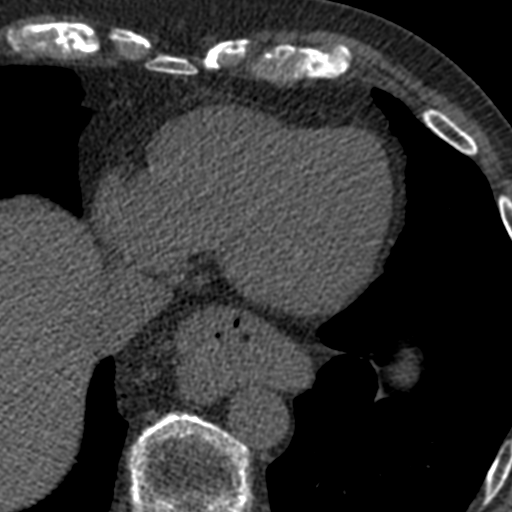
[im 27/68  vessel]
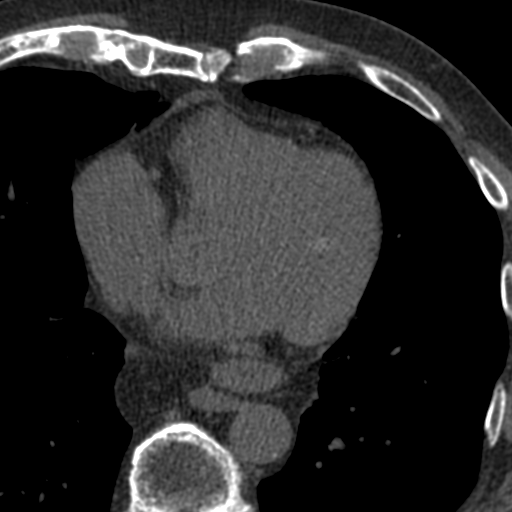
[im 41/68  vessel]
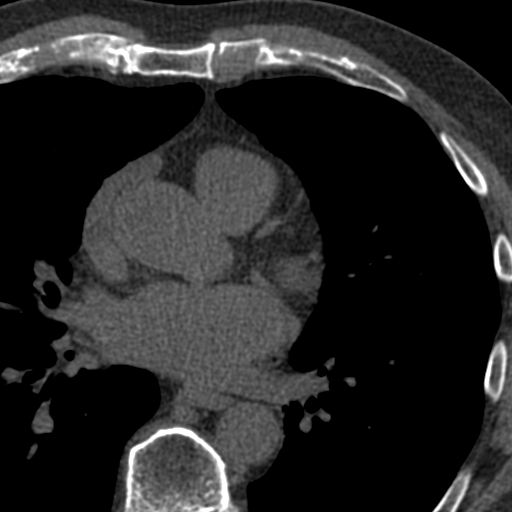
[im 54/68  vessel]
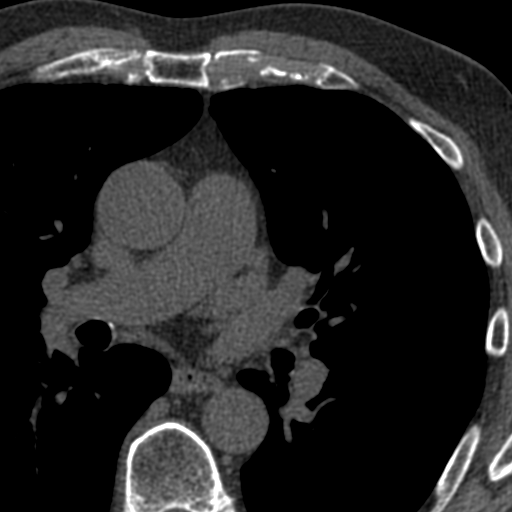

[Series 3: calcium scoring 2.00 br40 bestdiast 71% axial · axial · 0.57mm/px · z∈[+1654,+1742]mm · 5 of 68 slices shown, 7 images]
[im 12/68  vessel]
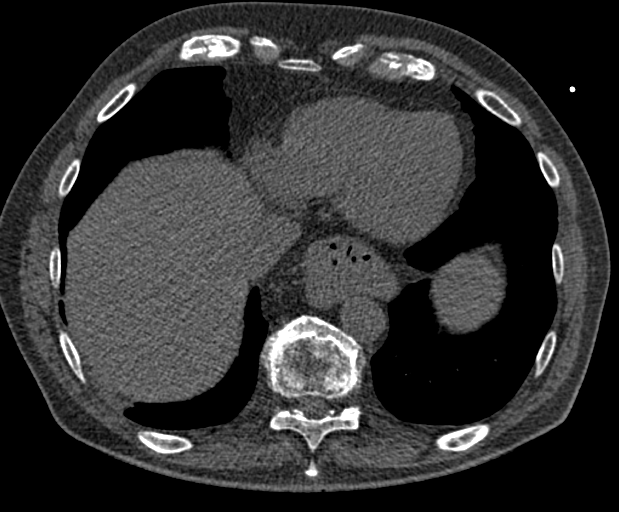
[im 12/68  lung]
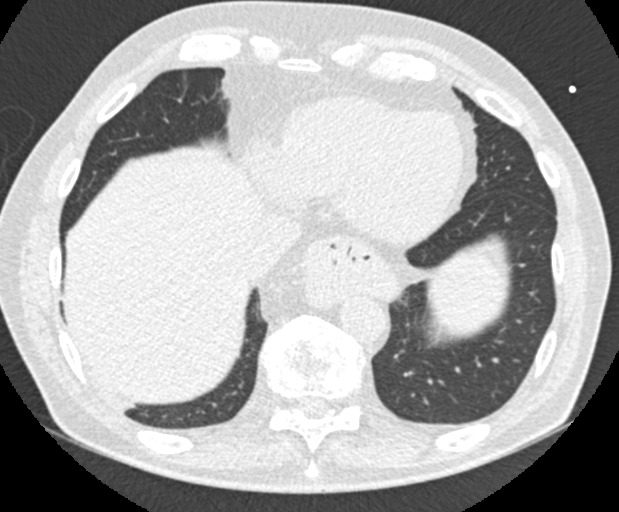
[im 23/68  vessel]
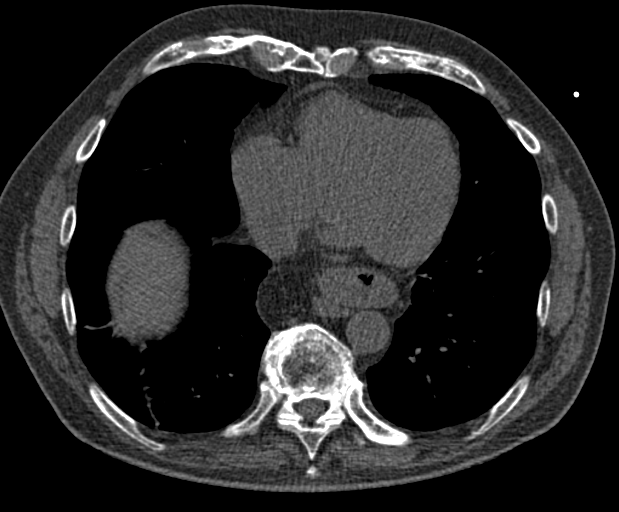
[im 34/68  vessel]
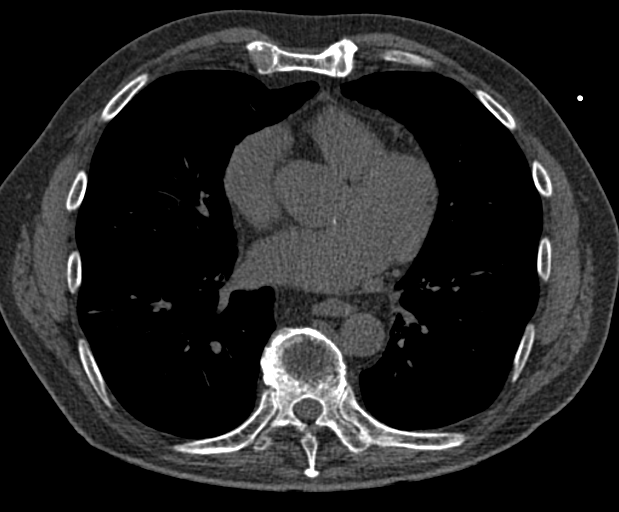
[im 45/68  vessel]
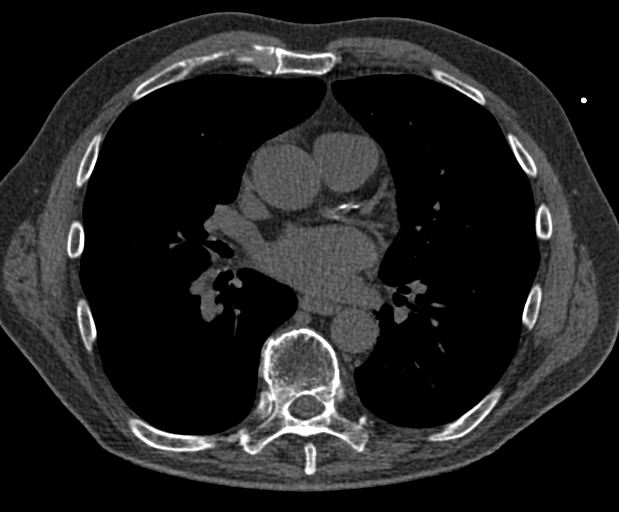
[im 56/68  vessel]
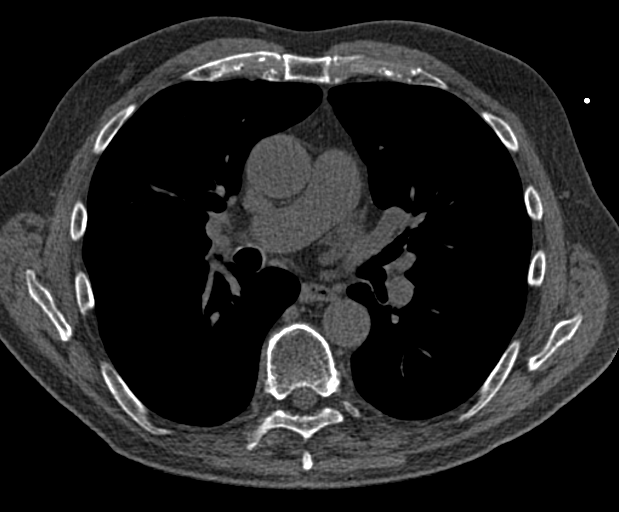
[im 56/68  lung]
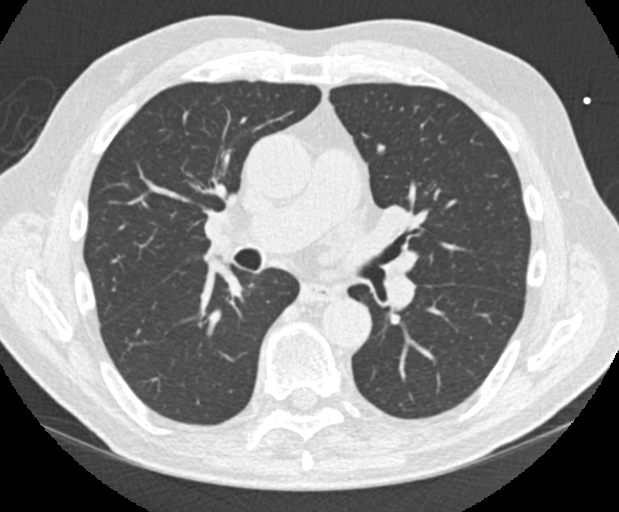

[Series 9: calcium scoring 2.00 br60 bestdiast 71% lungs · axial · 0.57mm/px · z∈[+1654,+1742]mm · 5 of 68 slices shown]
[im 12/68  vessel]
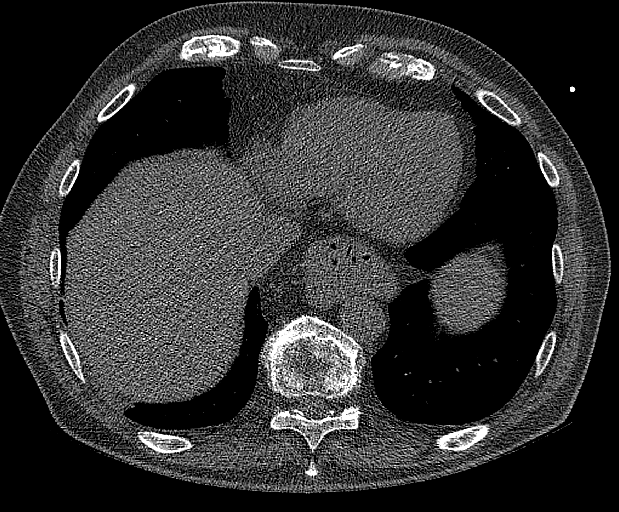
[im 23/68  vessel]
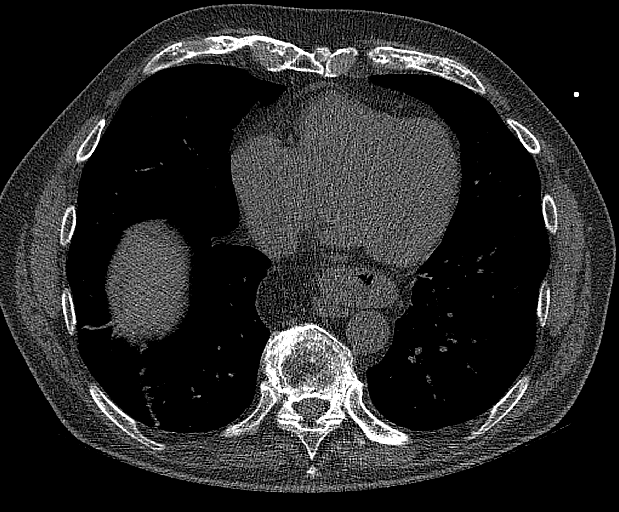
[im 34/68  vessel]
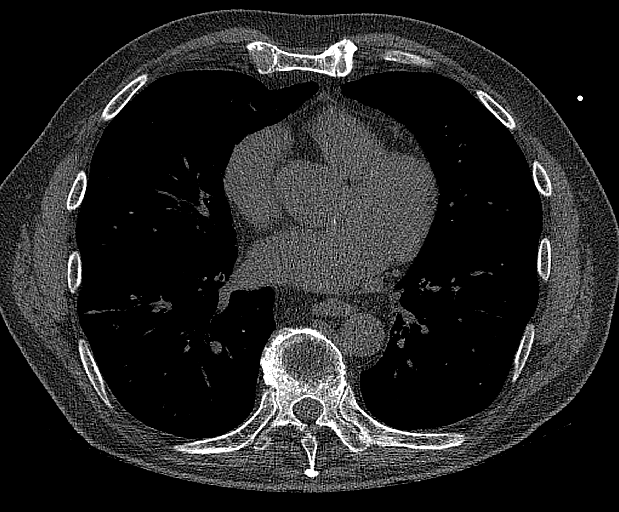
[im 45/68  vessel]
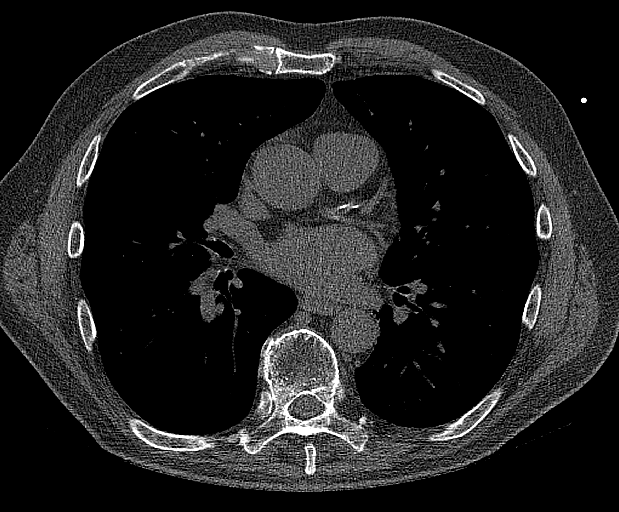
[im 56/68  vessel]
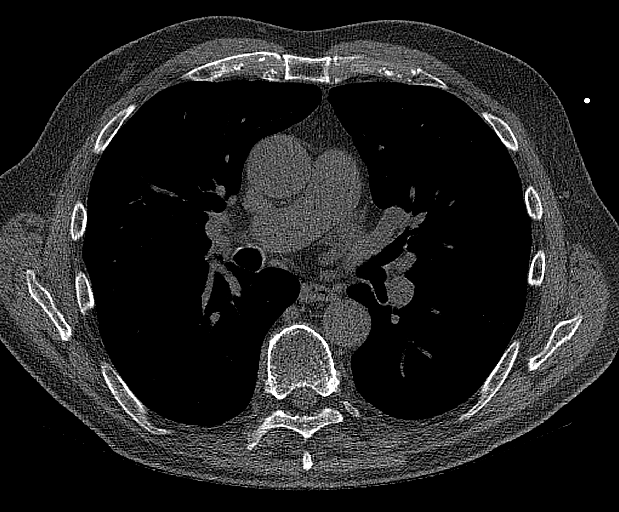

[14 of 20 positions shown; findings below may reference images not displayed]

FINDINGS: CORONARY CALCIUM SCORES:

Left Main: 0

LAD: 110

LCx:

RCA: 0

Total Agatston Score: 122

[HOSPITAL] percentile: 37

AORTA MEASUREMENTS:

Ascending Aorta: 36 mm

Descending Aorta: 28 mm

OTHER FINDINGS:

Atherosclerotic calcifications at the aortic root. Moderate sized
hiatal hernia. Small amount of fluid in the pericardial recess.
Minimal pericardial fluid. Heart size is normal. Soft tissue or
lymph node along the right side of the hiatal hernia measures 6 mm
in the short axis on sequence 3, image 61. This lymph node has
minimally changed since 7168. Images of the upper abdomen are
unremarkable. No pleural effusions. Bandlike densities in the right
lower lobe are suggestive for scarring. No significant consolidation
or airspace disease in the visualized lungs. Bridging osteophytes in
the thoracic spine.
IMPRESSION: 1. Coronary calcium score is 122 and this is at percentile 37 for
patients of the same age, gender and ethnicity.
2.  Aortic Atherosclerosis ([id number]-[id number]).
3. Hiatal hernia.

## 2022-05-21 DIAGNOSIS — M25551 Pain in right hip: Secondary | ICD-10-CM | POA: Diagnosis not present

## 2022-05-21 DIAGNOSIS — E785 Hyperlipidemia, unspecified: Secondary | ICD-10-CM | POA: Diagnosis not present

## 2022-05-21 DIAGNOSIS — R7301 Impaired fasting glucose: Secondary | ICD-10-CM | POA: Diagnosis not present

## 2022-05-21 DIAGNOSIS — C61 Malignant neoplasm of prostate: Secondary | ICD-10-CM | POA: Diagnosis not present

## 2022-05-21 DIAGNOSIS — K219 Gastro-esophageal reflux disease without esophagitis: Secondary | ICD-10-CM | POA: Diagnosis not present

## 2022-05-21 DIAGNOSIS — I10 Essential (primary) hypertension: Secondary | ICD-10-CM | POA: Diagnosis not present

## 2022-05-21 DIAGNOSIS — R42 Dizziness and giddiness: Secondary | ICD-10-CM | POA: Diagnosis not present

## 2022-05-22 DIAGNOSIS — H5213 Myopia, bilateral: Secondary | ICD-10-CM | POA: Diagnosis not present

## 2022-05-26 DIAGNOSIS — R42 Dizziness and giddiness: Secondary | ICD-10-CM | POA: Diagnosis not present

## 2022-05-27 DIAGNOSIS — Z0100 Encounter for examination of eyes and vision without abnormal findings: Secondary | ICD-10-CM | POA: Diagnosis not present

## 2022-06-19 DIAGNOSIS — M25551 Pain in right hip: Secondary | ICD-10-CM | POA: Diagnosis not present

## 2022-07-03 DIAGNOSIS — E785 Hyperlipidemia, unspecified: Secondary | ICD-10-CM | POA: Diagnosis not present

## 2022-07-03 DIAGNOSIS — R7301 Impaired fasting glucose: Secondary | ICD-10-CM | POA: Diagnosis not present

## 2022-07-03 DIAGNOSIS — M859 Disorder of bone density and structure, unspecified: Secondary | ICD-10-CM | POA: Diagnosis not present

## 2022-07-03 DIAGNOSIS — K219 Gastro-esophageal reflux disease without esophagitis: Secondary | ICD-10-CM | POA: Diagnosis not present

## 2022-07-03 DIAGNOSIS — I10 Essential (primary) hypertension: Secondary | ICD-10-CM | POA: Diagnosis not present

## 2022-07-03 DIAGNOSIS — R7989 Other specified abnormal findings of blood chemistry: Secondary | ICD-10-CM | POA: Diagnosis not present

## 2022-07-03 DIAGNOSIS — M8589 Other specified disorders of bone density and structure, multiple sites: Secondary | ICD-10-CM | POA: Diagnosis not present

## 2022-07-03 DIAGNOSIS — Z125 Encounter for screening for malignant neoplasm of prostate: Secondary | ICD-10-CM | POA: Diagnosis not present

## 2022-07-11 DIAGNOSIS — Z1339 Encounter for screening examination for other mental health and behavioral disorders: Secondary | ICD-10-CM | POA: Diagnosis not present

## 2022-07-11 DIAGNOSIS — M25551 Pain in right hip: Secondary | ICD-10-CM | POA: Diagnosis not present

## 2022-07-11 DIAGNOSIS — Z0000 Encounter for general adult medical examination without abnormal findings: Secondary | ICD-10-CM | POA: Diagnosis not present

## 2022-07-11 DIAGNOSIS — R7301 Impaired fasting glucose: Secondary | ICD-10-CM | POA: Diagnosis not present

## 2022-07-11 DIAGNOSIS — I10 Essential (primary) hypertension: Secondary | ICD-10-CM | POA: Diagnosis not present

## 2022-07-11 DIAGNOSIS — Z1331 Encounter for screening for depression: Secondary | ICD-10-CM | POA: Diagnosis not present

## 2022-07-11 DIAGNOSIS — E785 Hyperlipidemia, unspecified: Secondary | ICD-10-CM | POA: Diagnosis not present

## 2022-07-11 DIAGNOSIS — I700 Atherosclerosis of aorta: Secondary | ICD-10-CM | POA: Diagnosis not present

## 2022-07-11 DIAGNOSIS — I129 Hypertensive chronic kidney disease with stage 1 through stage 4 chronic kidney disease, or unspecified chronic kidney disease: Secondary | ICD-10-CM | POA: Diagnosis not present

## 2022-07-11 DIAGNOSIS — D7589 Other specified diseases of blood and blood-forming organs: Secondary | ICD-10-CM | POA: Diagnosis not present

## 2022-07-11 DIAGNOSIS — N1831 Chronic kidney disease, stage 3a: Secondary | ICD-10-CM | POA: Diagnosis not present

## 2022-07-11 DIAGNOSIS — R82998 Other abnormal findings in urine: Secondary | ICD-10-CM | POA: Diagnosis not present

## 2022-07-11 DIAGNOSIS — C61 Malignant neoplasm of prostate: Secondary | ICD-10-CM | POA: Diagnosis not present

## 2022-07-11 DIAGNOSIS — M8580 Other specified disorders of bone density and structure, unspecified site: Secondary | ICD-10-CM | POA: Diagnosis not present

## 2022-09-11 DIAGNOSIS — D225 Melanocytic nevi of trunk: Secondary | ICD-10-CM | POA: Diagnosis not present

## 2022-09-11 DIAGNOSIS — L570 Actinic keratosis: Secondary | ICD-10-CM | POA: Diagnosis not present

## 2022-09-11 DIAGNOSIS — D2271 Melanocytic nevi of right lower limb, including hip: Secondary | ICD-10-CM | POA: Diagnosis not present

## 2022-09-11 DIAGNOSIS — L812 Freckles: Secondary | ICD-10-CM | POA: Diagnosis not present

## 2022-09-11 DIAGNOSIS — D1801 Hemangioma of skin and subcutaneous tissue: Secondary | ICD-10-CM | POA: Diagnosis not present

## 2022-09-11 DIAGNOSIS — L821 Other seborrheic keratosis: Secondary | ICD-10-CM | POA: Diagnosis not present

## 2023-01-02 DIAGNOSIS — R7301 Impaired fasting glucose: Secondary | ICD-10-CM | POA: Diagnosis not present

## 2023-02-24 DIAGNOSIS — L570 Actinic keratosis: Secondary | ICD-10-CM | POA: Diagnosis not present

## 2023-02-24 DIAGNOSIS — L812 Freckles: Secondary | ICD-10-CM | POA: Diagnosis not present

## 2023-02-24 DIAGNOSIS — D485 Neoplasm of uncertain behavior of skin: Secondary | ICD-10-CM | POA: Diagnosis not present

## 2023-02-24 DIAGNOSIS — D2272 Melanocytic nevi of left lower limb, including hip: Secondary | ICD-10-CM | POA: Diagnosis not present

## 2023-02-24 DIAGNOSIS — L821 Other seborrheic keratosis: Secondary | ICD-10-CM | POA: Diagnosis not present

## 2023-02-24 DIAGNOSIS — C44719 Basal cell carcinoma of skin of left lower limb, including hip: Secondary | ICD-10-CM | POA: Diagnosis not present

## 2023-02-24 DIAGNOSIS — D2271 Melanocytic nevi of right lower limb, including hip: Secondary | ICD-10-CM | POA: Diagnosis not present

## 2023-02-24 DIAGNOSIS — D1801 Hemangioma of skin and subcutaneous tissue: Secondary | ICD-10-CM | POA: Diagnosis not present

## 2023-03-04 DIAGNOSIS — I10 Essential (primary) hypertension: Secondary | ICD-10-CM | POA: Diagnosis not present

## 2023-03-04 DIAGNOSIS — M79675 Pain in left toe(s): Secondary | ICD-10-CM | POA: Diagnosis not present

## 2023-06-03 DIAGNOSIS — H5213 Myopia, bilateral: Secondary | ICD-10-CM | POA: Diagnosis not present

## 2023-12-01 DIAGNOSIS — K8000 Calculus of gallbladder with acute cholecystitis without obstruction: Secondary | ICD-10-CM | POA: Diagnosis not present

## 2023-12-02 DIAGNOSIS — K8000 Calculus of gallbladder with acute cholecystitis without obstruction: Secondary | ICD-10-CM | POA: Diagnosis not present

## 2023-12-10 DIAGNOSIS — Z125 Encounter for screening for malignant neoplasm of prostate: Secondary | ICD-10-CM | POA: Diagnosis not present

## 2023-12-10 DIAGNOSIS — Z1212 Encounter for screening for malignant neoplasm of rectum: Secondary | ICD-10-CM | POA: Diagnosis not present

## 2023-12-10 DIAGNOSIS — E7849 Other hyperlipidemia: Secondary | ICD-10-CM | POA: Diagnosis not present

## 2023-12-15 DIAGNOSIS — Z1331 Encounter for screening for depression: Secondary | ICD-10-CM | POA: Diagnosis not present

## 2023-12-15 DIAGNOSIS — Z23 Encounter for immunization: Secondary | ICD-10-CM | POA: Diagnosis not present

## 2023-12-22 DIAGNOSIS — D1801 Hemangioma of skin and subcutaneous tissue: Secondary | ICD-10-CM | POA: Diagnosis not present

## 2023-12-22 DIAGNOSIS — L812 Freckles: Secondary | ICD-10-CM | POA: Diagnosis not present

## 2023-12-22 DIAGNOSIS — L821 Other seborrheic keratosis: Secondary | ICD-10-CM | POA: Diagnosis not present

## 2023-12-22 DIAGNOSIS — L570 Actinic keratosis: Secondary | ICD-10-CM | POA: Diagnosis not present

## 2023-12-25 ENCOUNTER — Other Ambulatory Visit (HOSPITAL_COMMUNITY): Payer: Self-pay | Admitting: General Surgery

## 2023-12-25 DIAGNOSIS — K828 Other specified diseases of gallbladder: Secondary | ICD-10-CM | POA: Diagnosis not present

## 2023-12-29 ENCOUNTER — Other Ambulatory Visit: Payer: Self-pay

## 2023-12-29 ENCOUNTER — Observation Stay (HOSPITAL_COMMUNITY)
Admission: EM | Admit: 2023-12-29 | Discharge: 2023-12-30 | Disposition: A | Discharge status: Home / Self Care | Attending: Internal Medicine | Admitting: Internal Medicine

## 2023-12-29 ENCOUNTER — Encounter (HOSPITAL_COMMUNITY): Payer: Self-pay | Admitting: Emergency Medicine

## 2023-12-29 ENCOUNTER — Inpatient Hospital Stay (HOSPITAL_COMMUNITY)

## 2023-12-29 DIAGNOSIS — K851 Biliary acute pancreatitis without necrosis or infection: Principal | ICD-10-CM | POA: Insufficient documentation

## 2023-12-29 DIAGNOSIS — R109 Unspecified abdominal pain: Secondary | ICD-10-CM | POA: Diagnosis not present

## 2023-12-29 DIAGNOSIS — R7989 Other specified abnormal findings of blood chemistry: Secondary | ICD-10-CM | POA: Diagnosis not present

## 2023-12-29 DIAGNOSIS — R1013 Epigastric pain: Principal | ICD-10-CM

## 2023-12-29 DIAGNOSIS — N179 Acute kidney failure, unspecified: Secondary | ICD-10-CM | POA: Diagnosis not present

## 2023-12-29 DIAGNOSIS — F10920 Alcohol use, unspecified with intoxication, uncomplicated: Secondary | ICD-10-CM | POA: Diagnosis not present

## 2023-12-29 DIAGNOSIS — E785 Hyperlipidemia, unspecified: Secondary | ICD-10-CM | POA: Insufficient documentation

## 2023-12-29 DIAGNOSIS — Z79899 Other long term (current) drug therapy: Secondary | ICD-10-CM | POA: Diagnosis not present

## 2023-12-29 DIAGNOSIS — I10 Essential (primary) hypertension: Secondary | ICD-10-CM | POA: Diagnosis not present

## 2023-12-29 DIAGNOSIS — K8020 Calculus of gallbladder without cholecystitis without obstruction: Secondary | ICD-10-CM | POA: Diagnosis not present

## 2023-12-29 DIAGNOSIS — K449 Diaphragmatic hernia without obstruction or gangrene: Secondary | ICD-10-CM | POA: Diagnosis not present

## 2023-12-29 DIAGNOSIS — C61 Malignant neoplasm of prostate: Secondary | ICD-10-CM | POA: Diagnosis not present

## 2023-12-29 LAB — URINALYSIS, ROUTINE W REFLEX MICROSCOPIC
Bacteria, UA: NONE SEEN
Bilirubin Urine: NEGATIVE
Glucose, UA: NEGATIVE mg/dL
Hgb urine dipstick: NEGATIVE
Ketones, ur: NEGATIVE mg/dL
Leukocytes,Ua: NEGATIVE
Nitrite: NEGATIVE
Protein, ur: 30 mg/dL — AB
Specific Gravity, Urine: 1.026 (ref 1.005–1.030)
pH: 5 (ref 5.0–8.0)

## 2023-12-29 LAB — COMPREHENSIVE METABOLIC PANEL WITH GFR
ALT: 342 U/L — ABNORMAL HIGH (ref 0–44)
AST: 650 U/L — ABNORMAL HIGH (ref 15–41)
Albumin: 3.5 g/dL (ref 3.5–5.0)
Alkaline Phosphatase: 129 U/L — ABNORMAL HIGH (ref 38–126)
Anion gap: 12 (ref 5–15)
BUN: 20 mg/dL (ref 8–23)
CO2: 24 mmol/L (ref 22–32)
Calcium: 9.1 mg/dL (ref 8.9–10.3)
Chloride: 105 mmol/L (ref 98–111)
Creatinine, Ser: 1.63 mg/dL — ABNORMAL HIGH (ref 0.61–1.24)
GFR, Estimated: 43 mL/min — ABNORMAL LOW (ref >60)
Glucose, Bld: 163 mg/dL — ABNORMAL HIGH (ref 70–99)
Potassium: 4.2 mmol/L (ref 3.5–5.1)
Sodium: 141 mmol/L (ref 135–145)
Total Bilirubin: 2.7 mg/dL — ABNORMAL HIGH (ref 0.0–1.2)
Total Protein: 6.6 g/dL (ref 6.5–8.1)

## 2023-12-29 LAB — CBC
HCT: 42 % (ref 39.0–52.0)
Hemoglobin: 13.8 g/dL (ref 13.0–17.0)
MCH: 31.9 pg (ref 26.0–34.0)
MCHC: 32.9 g/dL (ref 30.0–36.0)
MCV: 97.2 fL (ref 80.0–100.0)
Platelets: 198 K/uL (ref 150–400)
RBC: 4.32 MIL/uL (ref 4.22–5.81)
RDW: 13 % (ref 11.5–15.5)
WBC: 3.8 K/uL — ABNORMAL LOW (ref 4.0–10.5)
nRBC: 0 % (ref 0.0–0.2)

## 2023-12-29 LAB — LIPASE, BLOOD: Lipase: 420 U/L — ABNORMAL HIGH (ref 11–51)

## 2023-12-29 MED ORDER — ENOXAPARIN SODIUM 40 MG/0.4ML IJ SOSY
40.0000 mg | PREFILLED_SYRINGE | INTRAMUSCULAR | Status: DC
  Administered 2023-12-29 – 2023-12-30 (×2): 40 mg via SUBCUTANEOUS
  Filled 2023-12-29 (×2): qty 0.4

## 2023-12-29 MED ORDER — ONDANSETRON HCL 4 MG/2ML IJ SOLN: 4.0000 mg | Freq: Four times a day (QID) | INTRAMUSCULAR | Status: DC | PRN

## 2023-12-29 MED ORDER — SODIUM CHLORIDE 0.9 % IV SOLN: INTRAVENOUS | Status: AC

## 2023-12-29 MED ORDER — GADOBUTROL 1 MMOL/ML IV SOLN
8.0000 mL | Freq: Once | INTRAVENOUS | Status: AC | PRN
  Administered 2023-12-29: 8 mL via INTRAVENOUS

## 2023-12-29 MED ORDER — ONDANSETRON 4 MG PO TBDP
4.0000 mg | ORAL_TABLET | Freq: Once | ORAL | Status: AC | PRN
  Administered 2023-12-29: 4 mg via ORAL
  Filled 2023-12-29: qty 1

## 2023-12-29 MED ORDER — MORPHINE SULFATE (PF) 2 MG/ML IV SOLN: 1.0000 mg | INTRAVENOUS | Status: DC | PRN

## 2023-12-29 MED ORDER — ACETAMINOPHEN 500 MG PO TABS
1000.0000 mg | ORAL_TABLET | Freq: Four times a day (QID) | ORAL | Status: DC | PRN
  Administered 2023-12-29 – 2023-12-30 (×2): 1000 mg via ORAL
  Filled 2023-12-29 (×2): qty 2

## 2023-12-29 NOTE — ED Provider Notes (Signed)
 [place] EMERGENCY DEPARTMENT AT [facility] Provider Note   CSN: [id number] Arrival date & time: 12/29/23  9590     Patient presents with: Abdominal Pain (emesis)   Michael Turner is a 78 y.o. male.   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  Patient reports that he has had 4 episodes of postprandial epigastric abdominal pain in the past year.  Says he was on a [place] cruise in [place] in September when they performed LFTs that were noted to be markedly elevated and the next day he went into the emergency department and had an ultrasound that did not show evidence of gallstones or cholecystitis they were worried about potentially gallstones in his common bile duct and recommended MRCP went home.  Has seen general surgery as an outpatient and they are trying to coordinate for HIDA scan.  Says that last night after eating a meal with some chicken he started experiencing epigastric abdominal pain.  Describes it as a dull sensation.  No radiation.  Had 4 episodes of nonbloody nonbilious emesis.  Pain is currently 1/10 in severity.       Prior to Admission medications   Medication Sig Start Date End Date Taking? Authorizing Provider  atorvastatin (LIPITOR) 40 MG tablet Take 40 mg by mouth daily.   Yes [provider]  Cholecalciferol (VITAMIN D-1000 MAX ST) 1000 units tablet Take 1,000 Units by mouth daily.   Yes [provider]  ezetimibe (ZETIA) 10 MG tablet Take 10 mg by mouth daily. 12/19/23  Yes [provider]  losartan (COZAAR) 100 MG tablet Take 100 mg by mouth daily.   Yes [provider]  Multiple Vitamins-Minerals (CENTRUM SILVER 50+MEN PO) Take 1 tablet by mouth daily.   Yes [provider]  omeprazole (PRILOSEC) 10 MG capsule Take 10 mg by mouth daily.   Yes [provider]    Allergies: Patient has no known allergies.    Review of  Systems  Updated Vital Signs BP 112/63 (BP Location: Right Arm)   Pulse (!) 102   Temp 99 F (37.2 C) (Oral)   Resp 18   Ht 5' 9 (1.753 m)   Wt 83.7 kg   SpO2 97%   BMI 27.25 kg/m   Physical Exam Constitutional:      Appearance: He is well-developed.  Abdominal:     General: There is no distension.     Palpations: There is no mass.     Tenderness: There is no abdominal tenderness. There is no guarding.     Comments: Negative Murphy sign  Neurological:     Mental Status: He is alert.     (all labs ordered are listed, but only abnormal results are displayed) Labs Reviewed  LIPASE, BLOOD - Abnormal; Notable for the following components:      Result Value   Lipase 420 (*)    All other components within normal limits  COMPREHENSIVE METABOLIC PANEL WITH GFR - Abnormal; Notable for the following components:   Glucose, Bld 163 (*)    Creatinine, Ser 1.63 (*)    AST 650 (*)    ALT 342 (*)    Alkaline Phosphatase 129 (*)    Total Bilirubin 2.7 (*)    GFR, Estimated 43 (*)    All other components within normal limits  CBC - Abnormal; Notable for the following components:   WBC 3.8 (*)    All other components within normal  limits  URINALYSIS, ROUTINE W REFLEX MICROSCOPIC - Abnormal; Notable for the following components:   Color, Urine AMBER (*)    Protein, ur 30 (*)    All other components within normal limits    EKG: None  Radiology: No results found.   Procedures   Medications Ordered in the ED  enoxaparin (LOVENOX) injection 40 mg (40 mg Subcutaneous Given 12/29/23 0848)  0.9 %  sodium chloride infusion ( Intravenous New Bag/Given 12/29/23 1234)  morphine (PF) 2 MG/ML injection 1 mg (has no administration in time range)  ondansetron  (ZOFRAN -ODT) disintegrating tablet 4 mg (4 mg Oral Given 12/29/23 0424)    Clinical Course as of 12/29/23 1302  Tue Dec 29, 2023  0745 Dw Vertell Pringle from general surgery who recommends MRCP and admission. [RP]    Clinical  Course User Index [RP] Yolande Lamar BROCKS, MD                                 Medical Decision Making Amount and/or Complexity of Data Reviewed Labs: ordered. Radiology: ordered.  Risk Prescription drug management. Decision regarding hospitalization.   Michael Turner is a 78 year old male history of Grovers disease who presents to the emergency department with abdominal pain.   Initial Ddx:  Cholecystitis, choledocholithiasis, gallstone pancreatitis, pancreatitis  MDM/Course:  Presents to the emergency department with upper abdominal pain.  Has had 4 episodes of this recently.  Had an ultrasound when he was in [place] which did not show any signs of cholecystitis.  Is following with general surgery.  Symptoms worsened last night.  On exam is not in acute distress.  Afebrile mildly tachycardic otherwise reassuring vitals.  Not having any significant abdominal tenderness palpation at this point in time.  Lab work was obtained which shows that he has mild AKI as well as elevated LFTs and T. bili.  Lipase also elevated at 420.  Discussed with general surgery who feels that he likely has gallstone pancreatitis or choledocholithiasis.  They recommend MRCP at this point in time.  Discussed with Dr. Georgina for hospitalist for admission.  Upon re-evaluation remained comfortable.  This patient presents to the ED for concern of complaints listed in HPI, this involves an extensive number of treatment options, and is a complaint that carries with it a high risk of complications and morbidity. Disposition including potential need for admission considered.   Dispo: Admit to Floor  Additional history obtained from spouse Records reviewed Outpatient Clinic Notes The following labs were independently interpreted: Chemistry and show elevated LFTs I personally reviewed and interpreted cardiac monitoring: sinus tachycardia I personally reviewed and interpreted the pt's EKG: see above for interpretation  I  have reviewed the patients home medications and made adjustments as needed Consults: Hospitalist and general surgery Social Determinants of health:  Geriatric  Portions of this note were generated with Scientist, clinical (histocompatibility and immunogenetics). Dictation errors may occur despite best attempts at proofreading.     Final diagnoses:  Epigastric abdominal pain  Abnormal LFTs    ED Discharge Orders     None          Yolande Lamar BROCKS, MD 12/29/23 1302

## 2023-12-29 NOTE — ED Notes (Signed)
 Transport called by [employer]

## 2023-12-29 NOTE — ED Triage Notes (Signed)
 Pt arrive c/o abd pain and vomiting that started last night at 11:00 pm. Pt states he is been treated lately for Gallbladder problems.

## 2023-12-29 NOTE — Consult Note (Signed)
 Consult Note  Michael Turner Feb 17, 1946  [id number].    Requesting MD: Marsha Ada, MD Chief Complaint/Reason for Consult:  Biliary pancreatitis   HPI:  Patient is a 78 year old male recently seen in [facility] office by Dr. Polly for evaluation of biliary colic who presented this morning with abdominal pain. This is his 4th episode of postprandial pain this year but last night he had a salad with dressing on the side and chicken with skin off and still developed symptoms whereas previously symptoms tended to be brought on with fattier meals. Pain was epigastric and severe in nature, non-radiating. Associated nausea and vomiting. Denies fever, chills. Pain currently is resolved. When he had been seen in the office he was hoping to avoid surgery but now with pancreatitis is willing to have gallbladder removed. Prior abdominal surgery includes prostatectomy and ventral hernia repair with mesh. He has had prior radiation for his prostate cancer. PMH otherwise significant for Hx of post-traumatic urethral stricture, HTN, CAD, HLD, Hx of Gilbert's syndrome and CKD stage IIIa. Of note, he reports his wife is scheduled to have a colonoscopy on Thursday - he would like to be able to take her to this if possible but states they will do what each needs to.   ROS: Negative other than HPI  Family History  Problem Relation Age of Onset   Cancer Brother        younger brother/prostate ca/tx with combo chemo, adt, radiation   Cancer Maternal Grandmother        bladder    Past Medical History:  Diagnosis Date   Bladder neck obstruction 2015   Cancer of prostate with intermediate recurrence risk (stage T2b-c or Gleason 7 or PSA 10-20) (HCC) 2015   Elevated prostate specific antigen (PSA) 2015   Hernia of anterior abdominal wall 2015   Malignant tumor of prostate (HCC) 2015   Multiple closed anterior-posterior compression fractures of pelvis (HCC)    Post-traumatic anterior urethral stricture 2015     Past Surgical History:  Procedure Laterality Date   CYSTOSCOPY  01/05/2014   INSERTION OF MESH Right 08/12/2017   Procedure: INSERTION OF MESH;  Surgeon: Belinda Cough, MD;  Location: [facility];  Service: General;  Laterality: Right;   INTERNAL URETHROTOMY  01/05/2014   PARASTOMAL HERNIA REPAIR Right 08/12/2017   Procedure: VENTRAL INCISIONAL HERNIA REPAIR WITH MESH;  Surgeon: Belinda Cough, MD;  Location: [facility];  Service: General;  Laterality: Right;   PROSTATE BIOPSY  06/24/2013   Dr. Tanda Jama Moats III   ROBOT ASSISTED LAPAROSCOPIC RADICAL PROSTATECTOMY  10/11/2013   [facility]    Social History:  reports that he has never smoked. He has never used smokeless tobacco. He reports current alcohol  use of about 2.0 - 3.0 standard drinks of alcohol  per week. He reports that he does not use drugs.  Allergies: No Known Allergies  Medications Prior to Admission  Medication Sig Dispense Refill   atorvastatin (LIPITOR) 40 MG tablet Take 40 mg by mouth daily.     Cholecalciferol (VITAMIN D-1000 MAX ST) 1000 units tablet Take 1,000 Units by mouth daily.     ezetimibe (ZETIA) 10 MG tablet Take 10 mg by mouth daily.     losartan (COZAAR) 100 MG tablet Take 100 mg by mouth daily.     Multiple Vitamins-Minerals (CENTRUM SILVER 50+MEN PO) Take 1 tablet by mouth daily.     omeprazole (PRILOSEC) 10 MG capsule Take  10 mg by mouth daily.      Blood pressure 112/63, pulse (!) 102, temperature 99 F (37.2 C), temperature source Oral, resp. rate 18, height 5' 9 (1.753 m), weight 83.7 kg, SpO2 97%. Physical Exam:  General: pleasant, WD, WN male who is laying in bed in NAD HEENT: head is normocephalic, atraumatic.  Sclera are anicteric.  Ears and nose without any masses or lesions.  Mouth is pink and dry Heart: regular, rate, and rhythm.   Lungs: Respiratory effort nonlabored Abd: soft, minimal ttp in epigastric abdomen without peritonitis, ND MS: all 4  extremities are symmetrical with no cyanosis, clubbing, or edema. Skin: warm and dry with no masses, lesions, or rashes Neuro: Cranial nerves 2-12 grossly intact, sensation is normal throughout Psych: A&Ox3 with an appropriate affect.   Results for orders placed or performed during the hospital encounter of 12/29/23 (from the past 48 hours)  Lipase, blood     Status: Abnormal   Collection Time: 12/29/23  4:24 AM  Result Value Ref Range   Lipase 420 (H) 11 - 51 U/L    Comment: RESULTS CONFIRMED BY MANUAL DILUTION Performed at [facility], [address] [place], [place] [address]   Comprehensive metabolic panel     Status: Abnormal   Collection Time: 12/29/23  4:24 AM  Result Value Ref Range   Sodium 141 135 - 145 mmol/L   Potassium 4.2 3.5 - 5.1 mmol/L   Chloride 105 98 - 111 mmol/L   CO2 24 22 - 32 mmol/L   Glucose, Bld 163 (H) 70 - 99 mg/dL    Comment: Glucose reference range applies only to samples taken after fasting for at least 8 hours.   BUN 20 8 - 23 mg/dL   Creatinine, Ser 8.36 (H) 0.61 - 1.24 mg/dL   Calcium 9.1 8.9 - 89.6 mg/dL   Total Protein 6.6 6.5 - 8.1 g/dL   Albumin 3.5 3.5 - 5.0 g/dL   AST 349 (H) 15 - 41 U/L   ALT 342 (H) 0 - 44 U/L   Alkaline Phosphatase 129 (H) 38 - 126 U/L   Total Bilirubin 2.7 (H) 0.0 - 1.2 mg/dL   GFR, Estimated 43 (L) >60 mL/min    Comment: (NOTE) Calculated using the CKD-EPI Creatinine Equation (2021)    Anion gap 12 5 - 15    Comment: Performed at [facility], [address] [place], [place] [address]  CBC     Status: Abnormal   Collection Time: 12/29/23  4:24 AM  Result Value Ref Range   WBC 3.8 (L) 4.0 - 10.5 K/uL   RBC 4.32 4.22 - 5.81 MIL/uL   Hemoglobin 13.8 13.0 - 17.0 g/dL   HCT 57.9 60.9 - 47.9 %   MCV 97.2 80.0 - 100.0 fL   MCH 31.9 26.0 - 34.0 pg   MCHC 32.9 30.0 - 36.0 g/dL   RDW 86.9 88.4 - 84.4 %   Platelets 198 150 - 400 K/uL   nRBC 0.0 0.0 - 0.2 %    Comment: Performed at [facility]  Lab, [address] [place], [place] [address]  Urinalysis, Routine w reflex microscopic -Urine, Clean Catch     Status: Abnormal   Collection Time: 12/29/23  4:29 AM  Result Value Ref Range   Color, Urine AMBER (A) YELLOW    Comment: BIOCHEMICALS MAY BE AFFECTED BY COLOR   APPearance CLEAR CLEAR   Specific Gravity, Urine 1.026 1.005 - 1.030  pH 5.0 5.0 - 8.0   Glucose, UA NEGATIVE NEGATIVE mg/dL   Hgb urine dipstick NEGATIVE NEGATIVE   Bilirubin Urine NEGATIVE NEGATIVE   Ketones, ur NEGATIVE NEGATIVE mg/dL   Protein, ur 30 (A) NEGATIVE mg/dL   Nitrite NEGATIVE NEGATIVE   Leukocytes,Ua NEGATIVE NEGATIVE   RBC / HPF 0-5 0 - 5 RBC/hpf   WBC, UA 0-5 0 - 5 WBC/hpf   Bacteria, UA NONE SEEN NONE SEEN   Squamous Epithelial / HPF 0-5 0 - 5 /HPF   Mucus PRESENT     Comment: Performed at [facility], [address], [place], [place] [address]   No results found.    Assessment/Plan Acute biliary pancreatitis  - MRCP ordered for today  - lipase 420, AST/ALT 650/342, Tbili 2.7 - seen in the office for biliary colic 10/10 but now presenting with pancreatitis  - likely proceed to OR tomorrow for lap chole if scheduling allows and pancreatitis without complicating features and labs improving  - make NPO after MN but from surgery standpoint ok to have CLD after MRCP  FEN: ok to have CLD after MRCP, NPO after MN VTE: LMWH ID: no current indication for abx  - per [facility] -   Hx of prostate cancer s/p prostatectomy and radiation  Hx of post-traumatic urethral stricture HTN CAD, stable without angina HLD Hx of Gilbert's syndrome CKD stage IIIa  I reviewed ED provider notes, hospitalist notes, last 24 h vitals and pain scores, last 48 h intake and output, last 24 h labs and trends, and last 24 h imaging results.  This care required high  level of medical decision making.   Burnard JONELLE Louder, [facility] 12/29/2023, 11:48 AM Please see Amion for pager number during  day hours 7:00am-4:30pm

## 2023-12-29 NOTE — Plan of Care (Signed)

## 2023-12-29 NOTE — H&P (Signed)
 History and Physical    Patient: Michael Turner FMW:[id number] DOB: [birth year] DOA: 12/29/2023 DOS: the patient was seen and examined on 12/29/2023 PCP: Shayne Anes, MD  Patient coming from: Home  Chief Complaint:  Chief Complaint  Patient presents with   Abdominal Pain    emesis   HPI: Michael Turner is a 78 y.o. male with medical history significant of HTN, HLD, and prostate cancer (Gleason 7; s/p robotic prostatectomy) p/w recurrent abdominal pain after eating and found to have acute biliary pancreatitis.  The patient reported experiencing abdominal pain that began last night at 11 PM while reading in bed. The pain started as a small ache and intensified to a severity of 5 to 7 over three to four hours, before subsiding over an additional three to four hours. The pain has since resolved and is currently not present. The patient mentioned this was the fourth significant episode over the years, with previous episodes being less intense. The patient experienced significant vomiting four times during the episode, with the latter instances involving bile. The decision to seek medical attention was made at 4 AM.   In the ED, pt hypotensive and tachycardic. Labs notable for Cr 1.63 (baseline 1.06 in 2015), lipase 420, AST/ALT 650/342, and total bilrubin 2.7. EDP started IVF, consulted EGS and requested medicine admission.   Review of Systems: As mentioned in the history of present illness. All other systems reviewed and are negative. Past Medical History:  Diagnosis Date   Bladder neck obstruction 2015   Cancer of prostate with intermediate recurrence risk (stage T2b-c or Gleason 7 or PSA 10-20) (HCC) 2015   Elevated prostate specific antigen (PSA) 2015   Hernia of anterior abdominal wall 2015   Malignant tumor of prostate (HCC) 2015   Multiple closed anterior-posterior compression fractures of pelvis (HCC)    Post-traumatic anterior urethral stricture 2015   Past Surgical History:   Procedure Laterality Date   CYSTOSCOPY  01/05/2014   INSERTION OF MESH Right 08/12/2017   Procedure: INSERTION OF MESH;  Surgeon: Belinda Cough, MD;  Location: [facility];  Service: General;  Laterality: Right;   INTERNAL URETHROTOMY  01/05/2014   PARASTOMAL HERNIA REPAIR Right 08/12/2017   Procedure: VENTRAL INCISIONAL HERNIA REPAIR WITH MESH;  Surgeon: Belinda Cough, MD;  Location: [facility];  Service: General;  Laterality: Right;   PROSTATE BIOPSY  06/24/2013   Dr. Tanda Jama Moats III   ROBOT ASSISTED LAPAROSCOPIC RADICAL PROSTATECTOMY  10/11/2013   [facility]   Social History:  reports that he has never smoked. He has never used smokeless tobacco. He reports current alcohol  use of about 2.0 - 3.0 standard drinks of alcohol  per week. He reports that he does not use drugs.  No Known Allergies  Family History  Problem Relation Age of Onset   Cancer Brother        younger brother/prostate ca/tx with combo chemo, adt, radiation   Cancer Maternal Grandmother        bladder    Prior to Admission medications   Medication Sig Start Date End Date Taking? Authorizing Provider  atorvastatin (LIPITOR) 40 MG tablet Take 40 mg by mouth daily.   Yes [provider]  Cholecalciferol (VITAMIN D-1000 MAX ST) 1000 units tablet Take 1,000 Units by mouth daily.   Yes [provider]  ezetimibe (ZETIA) 10 MG tablet Take 10 mg by mouth daily. 12/19/23  Yes [provider]  losartan (COZAAR) 100 MG tablet Take 100  mg by mouth daily.   Yes [provider]  Multiple Vitamins-Minerals (CENTRUM SILVER 50+MEN PO) Take 1 tablet by mouth daily.   Yes [provider]  omeprazole (PRILOSEC) 10 MG capsule Take 10 mg by mouth daily.   Yes [provider]    Physical Exam: Vitals:   12/29/23 0730 12/29/23 0830 12/29/23 0846 12/29/23 1048  BP: (!) 97/54 (!) 108/90  112/63  Pulse: (!) 107 (!) 102  (!) 102  Resp:  18  18   Temp:   98.5 F (36.9 C) 99 F (37.2 C)  TempSrc:   Oral Oral  SpO2: 98% 100%  97%  Weight:      Height:       General: Alert, oriented x3, resting comfortably in no acute distress HEENT: EOMI, oropharynx clear, moist mucous membranes, hearing intact Neck: Trachea midline and no gross thyromegaly Respiratory: Lungs clear to auscultation bilaterally with normal respiratory effort; no w/r/r Cardiovascular: Regular rate and rhythm w/o m/r/g Abdomen: Soft, nontender, nondistended. Positive bowel sounds MSK: No obvious joint deformities or swelling Skin: No obvious rashes or lesions Neurologic: Awake, alert, spontaneously moves all extremities, strength intact Psychiatric: Appropriate mood and affect, conversational and cooperative   Data Reviewed:  Lab Results  Component Value Date   WBC 3.8 (L) 12/29/2023   HGB 13.8 12/29/2023   HCT 42.0 12/29/2023   MCV 97.2 12/29/2023   PLT 198 12/29/2023   Lab Results  Component Value Date   GLUCOSE 163 (H) 12/29/2023   CALCIUM 9.1 12/29/2023   NA 141 12/29/2023   K 4.2 12/29/2023   CO2 24 12/29/2023   CL 105 12/29/2023   BUN 20 12/29/2023   CREATININE 1.63 (H) 12/29/2023   Lab Results  Component Value Date   ALT 342 (H) 12/29/2023   AST 650 (H) 12/29/2023   ALKPHOS 129 (H) 12/29/2023   BILITOT 2.7 (H) 12/29/2023   No results found for: INR, PROTIME Radiology: No results found.  Assessment and Plan: 55M h/o HTN, HLD, and prostate cancer (Gleason 7; s/p robotic prostactectomy) p/w recurrent abdominal pain after eating and found to have acute biliary pancreatitis.  Acute biliary pancreatitis -EGS consulted; recs: MRCP, clear liquid diet after MRCP, and possible CCY tomorrow; NPO at MN -MIVF: NS at 150cc/h for now -IV morphine 1mg  q4h prn for abdominal pain  AKI -MIVF: NS at 150cc/h for 24h -Strict I&Os and daily weights (standing preferred) -F/u BMP daily -Renally dose medications for CrCl -Avoid lovenox, NSAIDs,  morphine, Fleet's phosphate enema, regular insulin, contrast; no gadolinium for MRI to avoid nephrogenic systemic fibrosis -Consider renal US  and nephrology consult if worsening AKI  HTN -HOLD pta losartan 100mg  daily for now given AKI  HLD -HOLD pta atorvastatin for now   Advance Care Planning:   Code Status: Full Code   Consults: EGS  Family Communication: Son  Severity of Illness: The appropriate patient status for this patient is INPATIENT. Inpatient status is judged to be reasonable and necessary in order to provide the required intensity of service to ensure the patient's safety. The patient's presenting symptoms, physical exam findings, and initial radiographic and laboratory data in the context of their chronic comorbidities is felt to place them at high risk for further clinical deterioration. Furthermore, it is not anticipated that the patient will be medically stable for discharge from the hospital within 2 midnights of admission.   * I certify that at the point of admission it is my clinical judgment that the patient will  require inpatient hospital care spanning beyond 2 midnights from the point of admission due to high intensity of service, high risk for further deterioration and high frequency of surveillance required.*   ------- I spent 55 minutes reviewing previous notes, at the bedside counseling/discussing the treatment plan, and performing clinical documentation.  Author: Marsha Ada, MD 12/29/2023 1:02 PM  For on call review [email or web address].

## 2023-12-30 DIAGNOSIS — K8013 Calculus of gallbladder with acute and chronic cholecystitis with obstruction: Secondary | ICD-10-CM | POA: Diagnosis not present

## 2023-12-30 DIAGNOSIS — C61 Malignant neoplasm of prostate: Secondary | ICD-10-CM | POA: Diagnosis not present

## 2023-12-30 DIAGNOSIS — K851 Biliary acute pancreatitis without necrosis or infection: Secondary | ICD-10-CM | POA: Diagnosis not present

## 2023-12-30 LAB — COMPREHENSIVE METABOLIC PANEL WITH GFR
ALT: 473 U/L — ABNORMAL HIGH (ref 0–44)
AST: 367 U/L — ABNORMAL HIGH (ref 15–41)
Albumin: 2.5 g/dL — ABNORMAL LOW (ref 3.5–5.0)
Alkaline Phosphatase: 90 U/L (ref 38–126)
Anion gap: 7 (ref 5–15)
BUN: 20 mg/dL (ref 8–23)
CO2: 24 mmol/L (ref 22–32)
Calcium: 7.6 mg/dL — ABNORMAL LOW (ref 8.9–10.3)
Chloride: 107 mmol/L (ref 98–111)
Creatinine, Ser: 1.49 mg/dL — ABNORMAL HIGH (ref 0.61–1.24)
GFR, Estimated: 48 mL/min — ABNORMAL LOW (ref >60)
Glucose, Bld: 101 mg/dL — ABNORMAL HIGH (ref 70–99)
Potassium: 4.3 mmol/L (ref 3.5–5.1)
Sodium: 138 mmol/L (ref 135–145)
Total Bilirubin: 5.2 mg/dL — ABNORMAL HIGH (ref 0.0–1.2)
Total Protein: 5.2 g/dL — ABNORMAL LOW (ref 6.5–8.1)

## 2023-12-30 LAB — LIPASE, BLOOD: Lipase: 458 U/L — ABNORMAL HIGH (ref 11–51)

## 2023-12-30 MED ORDER — OXYCODONE HCL 5 MG PO CAPS: 5.0000 mg | ORAL_CAPSULE | ORAL | 0 refills | Status: DC | PRN

## 2023-12-30 NOTE — Progress Notes (Signed)
 Mobility Specialist Progress Note:   12/30/23 1027  Mobility  Activity Ambulated with assistance (In hallway)  Level of Assistance Modified independent, requires aide device or extra time  Assistive Device None  Distance Ambulated (ft) 325 ft  Activity Response Tolerated well  Mobility Referral Yes  Mobility visit 1 Mobility  Mobility Specialist Start Time (ACUTE ONLY) [id number]  Mobility Specialist Stop Time (ACUTE ONLY) 1008  Mobility Specialist Time Calculation (min) (ACUTE ONLY) 10 min   Received pt in bed having no complaints and agreeable to mobility. Pt was asymptomatic throughout ambulation and returned to room w/o fault. Left in bed w/ call bell in reach and all needs met.   Lavanda Pollack Mobility Specialist  Please contact via [employer] or  Rehab Office [phone]

## 2023-12-30 NOTE — TOC CM/SW Note (Signed)
 Transition of Care [facility]) - Inpatient Brief Assessment   Patient Details  Name: Michael Turner MRN: [id number] Date of Birth: 12/13/1945  [facility] of Care [facility]) CM/SW Contact:    Lauraine FORBES Saa, LCSWA Phone Number: 12/30/2023, 8:54 AM   Clinical Narrative:  8:54 AM Per chart review, patient resides at home with spouse. Patient has a PCP and insurance. Patient does not have SNF/HH/DME history. Patient's preferred pharmacy is [facility] [phone] [place]. No TOC needs identified at this time. TOC will continue to follow.  Transition of Care Asessment: Insurance and Status: Insurance coverage has been reviewed Patient has primary care physician: Yes Home environment has been reviewed: Private Residence Prior level of function:: N/A Prior/Current Home Services: No current home services Social Drivers of Health Review: SDOH reviewed no interventions necessary Readmission risk has been reviewed: Yes (Currently Green 10%) Transition of care needs: no transition of care needs at this time

## 2023-12-30 NOTE — Care Management CC44 (Signed)
 Condition Code 44 Documentation Completed  Patient Details  Name: ROGEN PORTE MRN: [id number] Date of Birth: 1945/04/15   Condition Code 44 given:  Yes Patient signature on Condition Code 44 notice:  Yes Documentation of 2 MD's agreement:  Yes Code 44 added to claim:  Yes    [name], RN 12/30/2023, 1:11 PM

## 2023-12-30 NOTE — Plan of Care (Signed)
  Problem: Education: Goal: Knowledge of General Education information will improve Description: Including pain rating scale, medication(s)/side effects and non-pharmacologic comfort measures Outcome: Progressing   Problem: Health Behavior/Discharge Planning: Goal: Ability to manage health-related needs will improve Outcome: Progressing   Problem: Clinical Measurements: Goal: Ability to maintain clinical measurements within normal limits will improve Outcome: Progressing Goal: Diagnostic test results will improve Outcome: Progressing Goal: Respiratory complications will improve Outcome: Progressing Goal: Cardiovascular complication will be avoided Outcome: Progressing   Problem: Clinical Measurements: Goal: Will remain free from infection Outcome: Not Progressing

## 2023-12-30 NOTE — Progress Notes (Signed)
 Progress Note     Subjective: Pt denies abdominal pain, nausea or vomiting. He prefers to defer surgery at this time but willing to schedule as an outpatient on a more urgent basis.   Objective: Vital signs in last 24 hours: Temp:  [97.9 F (36.6 C)-100.7 F (38.2 C)] 98.2 F (36.8 C) (10/15 0924) Pulse Rate:  [65-93] 83 (10/15 0924) Resp:  [16-19] 19 (10/15 0924) BP: (102-124)/(57-72) 124/72 (10/15 0924) SpO2:  [91 %-100 %] 91 % (10/15 0924)    Intake/Output from previous day: 10/14 0701 - 10/15 0700 In: 990.8 [I.V.:990.8] Out: -  Intake/Output this shift: No intake/output data recorded.  PE: General: pleasant, WD, WN male who is laying in bed in NAD HEENT: head is normocephalic, atraumatic.  Sclera are anicteric.  Ears and nose without any masses or lesions.  Mouth is pink and dry Heart: regular, rate, and rhythm.   Lungs: Respiratory effort nonlabored Abd: soft, NT, ND MS: all 4 extremities are symmetrical with no cyanosis, clubbing, or edema. Skin: warm and dry with no masses, lesions, or rashes Neuro: Cranial nerves 2-12 grossly intact, sensation is normal throughout Psych: A&Ox3 with an appropriate affect.   Lab Results:  Recent Labs    12/29/23 0424  WBC 3.8*  HGB 13.8  HCT 42.0  PLT 198   BMET Recent Labs    12/29/23 0424 12/30/23 0430  NA 141 138  K 4.2 4.3  CL 105 107  CO2 24 24  GLUCOSE 163* 101*  BUN 20 20  CREATININE 1.63* 1.49*  CALCIUM 9.1 7.6*   PT/INR No results for input(s): LABPROT, INR in the last 72 hours. CMP     Component Value Date/Time   NA 138 12/30/2023 0430   K 4.3 12/30/2023 0430   CL 107 12/30/2023 0430   CO2 24 12/30/2023 0430   GLUCOSE 101 (H) 12/30/2023 0430   BUN 20 12/30/2023 0430   CREATININE 1.49 (H) 12/30/2023 0430   CALCIUM 7.6 (L) 12/30/2023 0430   PROT 5.2 (L) 12/30/2023 0430   ALBUMIN 2.5 (L) 12/30/2023 0430   AST 367 (H) 12/30/2023 0430   ALT 473 (H) 12/30/2023 0430   ALKPHOS 90 12/30/2023  0430   BILITOT 5.2 (H) 12/30/2023 0430   GFRNONAA 48 (L) 12/30/2023 0430   Lipase     Component Value Date/Time   LIPASE 458 (H) 12/30/2023 0430       Studies/Results: MR 3D Recon At Scanner Result Date: 12/29/2023 CLINICAL DATA:  Abdominal pain and elevated liver function tests EXAM: 3-DIMENSIONAL MR IMAGE RENDERING ON ACQUISITION WORKSTATION TECHNIQUE: Multiplanar multisequence MR imaging of the abdomen was performed both before and after the administration of intravenous contrast. Heavily T2-weighted images of the biliary and pancreatic ducts were obtained. Post-processing was applied at the acquisition scanner with concurrent physician supervision which includes 3D reconstructions, MIPs, volume rendered images and/or shaded surface rendering. COMPARISON:  None Available. FINDINGS: Lower chest: No acute findings. Hepatobiliary: No mass or other parenchymal abnormality identified. No bile duct dilation. Cholelithiasis. Pancreas: No mass, inflammatory changes, or other parenchymal abnormality identified. Spleen: Within normal limits in size and appearance. Adrenals/Urinary Tract: No adrenal nodules. No suspicious renal masses identified. No evidence of hydronephrosis. Stomach/Bowel: Moderate hiatal hernia. Vascular/Lymphatic: No pathologically enlarged lymph nodes identified. No abdominal aortic aneurysm demonstrated. Aortic atherosclerosis. Other: None. Musculoskeletal: No suspicious bone lesions identified. IMPRESSION: 1. Cholelithiasis. No bile duct dilation. 2. Moderate hiatal hernia. Electronically Signed   By: Limin  Xu M.D.   On: 12/29/2023  17:09   MR ABDOMEN MRCP W WO CONTAST Result Date: 12/29/2023 CLINICAL DATA:  Abdominal pain and elevated liver function tests. EXAM: MRI ABDOMEN WITHOUT AND WITH CONTRAST (INCLUDING MRCP) TECHNIQUE: Multiplanar multisequence MR imaging of the abdomen was performed both before and after the administration of intravenous contrast. Heavily T2-weighted  images of the biliary and pancreatic ducts were obtained, and three-dimensional MRCP images were rendered by post processing. CONTRAST:  8mL GADAVIST GADOBUTROL 1 MMOL/ML IV SOLN COMPARISON:  CT abdomen and pelvis dated 05/06/2021 FINDINGS: Lower chest: No acute findings. Hepatobiliary: No mass or other parenchymal abnormality identified. No bile duct dilation. Cholelithiasis. Pancreas: No mass, inflammatory changes, or other parenchymal abnormality identified. Spleen:  Within normal limits in size and appearance. Adrenals/Urinary Tract: No adrenal nodules. No suspicious renal masses identified. No evidence of hydronephrosis. Stomach/Bowel: Moderate hiatal hernia. Vascular/Lymphatic: No pathologically enlarged lymph nodes identified. No abdominal aortic aneurysm demonstrated. Aortic atherosclerosis. Other:  None. Musculoskeletal: No suspicious bone lesions identified. IMPRESSION: 1. Cholelithiasis. No bile duct dilation. 2. Moderate hiatal hernia. Electronically Signed   By: Limin  Xu M.D.   On: 12/29/2023 14:37    Anti-infectives: Anti-infectives (From admission, onward)    None        Assessment/Plan Acute biliary pancreatitis  - MRCP negative for choledocholithiasis  - lipase 458 from 420, AST/ALT 367/473 from 650/342, Tbili 5.2 from 2.7 - clinically patient is without pain and tolerating FLD - he would like to defer surgery at this time so that he is able to take his wife to her colonoscopy tomorrow - I think LFT derangement is likely in part secondary to Hx of Gilbert's syndrome, particularly in setting of clinical improvement - will work on scheduling cholecystectomy in an expedited fashion as an outpatient  - ok for discharge from surgical perspective, will send Rx for pain meds but patient also given return precautions if recurrent symptoms in the interim    FEN: FLD VTE: LMWH ID: no current indication for abx   - per TRH -   Hx of prostate cancer s/p prostatectomy and radiation   Hx of post-traumatic urethral stricture HTN CAD, stable without angina HLD Hx of Gilbert's syndrome CKD stage IIIa    LOS: 1 day   I reviewed hospitalist notes, last 24 h vitals and pain scores, last 48 h intake and output, last 24 h labs and trends, and last 24 h imaging results.  This care required moderate level of medical decision making.    Burnard JONELLE Louder, [facility] Surgery 12/30/2023, 10:50 AM Please see [employer]ion for pager number during day hours 7:00am-4:30pm

## 2023-12-30 NOTE — Discharge Summary (Signed)
 Physician Discharge Summary   Patient: Michael Turner MRN: [id number] DOB: 1945/08/01  Admit date:     12/29/2023  Discharge date: 12/30/2023  Discharge Physician: Burnard DELENA Cunning   PCP: Shayne Anes, MD   Recommendations at discharge:   Follow-up outpatient with general surgery Repeat CBC, CMP in 1 week Follow-up with primary care in 1 to 2 weeks  Discharge Diagnoses: Principal Problem:   Gallstone pancreatitis  Resolved Problems:   * No resolved hospital problems. *  Hospital Course:  Michael Turner is a 78 y.o. male with medical history significant of HTN, HLD, and prostate cancer (Gleason 7; s/p robotic prostatectomy) p/w recurrent abdominal pain after eating and found to have acute biliary pancreatitis.   The patient reported experiencing abdominal pain that began last night at 11 PM while reading in bed. The pain started as a small ache and intensified to a severity of 5 to 7 over three to four hours, before subsiding over an additional three to four hours. The pain has since resolved and is currently not present. The patient mentioned this was the fourth significant episode over the years, with previous episodes being less intense. The patient experienced significant vomiting four times during the episode, with the latter instances involving bile. The decision to seek medical attention was made at 4 AM.    In the ED, pt hypotensive and tachycardic. Labs notable for Cr 1.63 (baseline 1.06 in 2015), lipase 420, AST/ALT 650/342, and total bilrubin 2.7. EDP started IVF, consulted EGS and requested medicine admission.  General surgery was consulted and saw the patient.  MRCP showed no obstructing stones.  Cholecystectomy was recommended but patient wanted this to be done on an elective basis and as outpatient due to needing to take his wife to colonoscopy on Thursday.  Patient is tolerating diet and pain is significantly improved.  Patient is medically stable and requesting  discharge today.   Assessment and Plan:  Acute biliary pancreatitis General Surgery consulted MRCP PE negative for choledocholithiasis Pain control as needed Follow-up outpatient with general surgery for elective cholecystectomy   AKI Creatinine improved 1.63 >> 1.49 > 1.34> Monitor BMP at follow-up   HTN Resume losartan on discharge   HLD Hold atorvastatin       Consultants: Surgery Procedures performed: None Disposition: Home Diet recommendation:  Discharge Diet Orders (From admission, onward)     Start     Ordered   12/30/23 0000  Diet - low sodium heart healthy        12/30/23 1234            DISCHARGE MEDICATION: Allergies as of 12/30/2023   No Known Allergies      Medication List     PAUSE taking these medications    atorvastatin 40 MG tablet Wait to take this until your doctor or other care provider tells you to start again. Commonly known as: LIPITOR Take 40 mg by mouth daily.       TAKE these medications    CENTRUM SILVER 50+MEN PO Take 1 tablet by mouth daily.   ezetimibe 10 MG tablet Commonly known as: ZETIA Take 10 mg by mouth daily.   losartan 100 MG tablet Commonly known as: COZAAR Take 100 mg by mouth daily.   omeprazole 10 MG capsule Commonly known as: PRILOSEC Take 10 mg by mouth daily.   oxycodone 5 MG capsule Commonly known as: OXY-IR Take 1 capsule (5 mg total) by mouth every 4 (four) hours as needed.  Vitamin D-1000 Max St 25 MCG (1000 UT) tablet Generic drug: Cholecalciferol Take 1,000 Units by mouth daily.        Follow-up Information     Polly Cordella LABOR, MD Follow up.   Specialty: General Surgery Why: Our office is working on scheduleing laparoscopic cholecystectomy - please call to confirm surgical timing Contact information: [address] [place] [address] [phone]                Discharge Exam: Fredricka Weights   12/29/23 0710  Weight: 83.7 kg   General  exam: awake, alert, no acute distress HEENT: atraumatic, clear conjunctiva, anicteric sclera, moist mucus membranes, hearing grossly normal  Respiratory system: CTAB, no wheezes, rales or rhonchi, normal respiratory effort. Cardiovascular system: normal S1/S2, RRR, no JVD, murmurs, rubs, gallops, no pedal edema.   Gastrointestinal system: soft, NT, ND, no HSM felt, +bowel sounds. Central nervous system: A&O x4. no gross focal neurologic deficits, normal speech Extremities: moves all , no edema, normal tone Skin: dry, intact, normal temperature, normal color No rashes, lesions or ulcers Psychiaty: normal mood, congruent affect, judgement and insight appear normal   Condition at discharge: stable  The results of significant diagnostics from this hospitalization (including imaging, microbiology, ancillary and laboratory) are listed below for reference.   Imaging Studies: MR 3D Recon At Scanner Result Date: 12/29/2023 CLINICAL DATA:  Abdominal pain and elevated liver function tests EXAM: 3-DIMENSIONAL MR IMAGE RENDERING ON ACQUISITION WORKSTATION TECHNIQUE: Multiplanar multisequence MR imaging of the abdomen was performed both before and after the administration of intravenous contrast. Heavily T2-weighted images of the biliary and pancreatic ducts were obtained. Post-processing was applied at the acquisition scanner with concurrent physician supervision which includes 3D reconstructions, MIPs, volume rendered images and/or shaded surface rendering. COMPARISON:  None Available. FINDINGS: Lower chest: No acute findings. Hepatobiliary: No mass or other parenchymal abnormality identified. No bile duct dilation. Cholelithiasis. Pancreas: No mass, inflammatory changes, or other parenchymal abnormality identified. Spleen: Within normal limits in size and appearance. Adrenals/Urinary Tract: No adrenal nodules. No suspicious renal masses identified. No evidence of hydronephrosis. Stomach/Bowel: Moderate hiatal  hernia. Vascular/Lymphatic: No pathologically enlarged lymph nodes identified. No abdominal aortic aneurysm demonstrated. Aortic atherosclerosis. Other: None. Musculoskeletal: No suspicious bone lesions identified. IMPRESSION: 1. Cholelithiasis. No bile duct dilation. 2. Moderate hiatal hernia. Electronically Signed   By: Limin  Xu M.D.   On: 12/29/2023 17:09   MR ABDOMEN MRCP W WO CONTAST Result Date: 12/29/2023 CLINICAL DATA:  Abdominal pain and elevated liver function tests. EXAM: MRI ABDOMEN WITHOUT AND WITH CONTRAST (INCLUDING MRCP) TECHNIQUE: Multiplanar multisequence MR imaging of the abdomen was performed both before and after the administration of intravenous contrast. Heavily T2-weighted images of the biliary and pancreatic ducts were obtained, and three-dimensional MRCP images were rendered by post processing. CONTRAST:  8mL GADAVIST GADOBUTROL 1 MMOL/ML IV SOLN COMPARISON:  CT abdomen and pelvis dated 05/06/2021 FINDINGS: Lower chest: No acute findings. Hepatobiliary: No mass or other parenchymal abnormality identified. No bile duct dilation. Cholelithiasis. Pancreas: No mass, inflammatory changes, or other parenchymal abnormality identified. Spleen:  Within normal limits in size and appearance. Adrenals/Urinary Tract: No adrenal nodules. No suspicious renal masses identified. No evidence of hydronephrosis. Stomach/Bowel: Moderate hiatal hernia. Vascular/Lymphatic: No pathologically enlarged lymph nodes identified. No abdominal aortic aneurysm demonstrated. Aortic atherosclerosis. Other:  None. Musculoskeletal: No suspicious bone lesions identified. IMPRESSION: 1. Cholelithiasis. No bile duct dilation. 2. Moderate hiatal hernia. Electronically Signed   By: Limin  Xu M.D.  On: 12/29/2023 14:37    Microbiology: Results for orders placed or performed in visit on 08/14/20  Novel Coronavirus, NAA ([facility])     Status: None   Collection Time: 08/14/20 11:16 AM   Specimen: Nasopharyngeal(NP) swabs  in vial transport medium   Nasopharynge  Testing  Result Value Ref Range Status   SARS-CoV-2, NAA Not Detected Not Detected Final    Comment: This nucleic acid amplification test was developed and its performance characteristics determined by [employer]. Nucleic acid amplification tests include RT-PCR and TMA. This test has not been FDA cleared or approved. This test has been authorized by FDA under an Emergency Use Authorization (EUA). This test is only authorized for the duration of time the declaration that circumstances exist justifying the authorization of the emergency use of in vitro diagnostic tests for detection of SARS-CoV-2 virus and/or diagnosis of COVID-19 infection under section 564(b)(1) of the Act, 21 U.S.C. 639aaa-6(a) (1), unless the authorization is terminated or revoked sooner. When diagnostic testing is negative, the possibility of a false negative result should be considered in the context of a patient's recent exposures and the presence of clinical signs and symptoms consistent with COVID-19. An individual without symptoms of COVID-19 and who is not shedding SARS-CoV-2 virus wo uld expect to have a negative (not detected) result in this assay.   SARS-COV-2, NAA 2 DAY TAT     Status: None   Collection Time: 08/14/20 11:16 AM   Nasopharynge  Testing  Result Value Ref Range Status   SARS-CoV-2, NAA 2 DAY TAT Performed  Final    Labs: CBC: Recent Labs  Lab 12/29/23 0424  WBC 3.8*  HGB 13.8  HCT 42.0  MCV 97.2  PLT 198   Basic Metabolic Panel: Recent Labs  Lab 12/29/23 0424 12/30/23 0430  NA 141 138  K 4.2 4.3  CL 105 107  CO2 24 24  GLUCOSE 163* 101*  BUN 20 20  CREATININE 1.63* 1.49*  CALCIUM 9.1 7.6*   Liver Function Tests: Recent Labs  Lab 12/29/23 0424 12/30/23 0430  AST 650* 367*  ALT 342* 473*  ALKPHOS 129* 90  BILITOT 2.7* 5.2*  PROT 6.6 5.2*  ALBUMIN 3.5 2.5*   CBG: No results for input(s): GLUCAP in the last  168 hours.  Discharge time spent: less than 30 minutes.  Signed: Burnard DELENA Cunning, DO [facility] 12/30/2023

## 2023-12-30 NOTE — Care Management Obs Status (Signed)
 MEDICARE OBSERVATION STATUS NOTIFICATION   Patient Details  Name: Michael Turner MRN: [id number] Date of Birth: 01-23-1946   Medicare Observation Status Notification Given:       [name], RN 12/30/2023, 1:10 PM

## 2023-12-30 NOTE — TOC Transition Note (Signed)
 Transition of Care [facility]) - Discharge Note   Patient Details  Name: Michael Turner MRN: [id number] Date of Birth: 1945-07-01  Transition of Care [facility]) CM/SW Contact:  [name], RN Phone Number: 12/30/2023, 1:34 PM   Clinical Narrative:    [name] is stable to discharge home. Follow up apt on [address]. No ICM (Inpatient Care Management) needs at this time.    Final next level of care: Home/Self Care Barriers to Discharge: Barriers Resolved   Patient Goals and CMS Choice Patient states their goals for this hospitalization and ongoing recovery are:: return home          Discharge Placement                   home    Discharge Plan and Services Additional resources added to the After Visit Summary for                                       Social Drivers of Health (SDOH) Interventions SDOH Screenings   Food Insecurity: No Food Insecurity (12/29/2023)  Housing: Low Risk  (12/29/2023)  Transportation Needs: No Transportation Needs (12/29/2023)  Utilities: Not At Risk (12/29/2023)  Social Connections: Socially Integrated (12/29/2023)  Tobacco Use: Low Risk  (12/29/2023)     Readmission Risk Interventions    12/30/2023    1:34 PM  Readmission Risk Prevention Plan  Post Dischage Appt Complete  Medication Screening Complete  Transportation Screening Complete

## 2024-01-02 ENCOUNTER — Other Ambulatory Visit: Payer: Self-pay

## 2024-01-02 ENCOUNTER — Inpatient Hospital Stay (HOSPITAL_COMMUNITY)
Admission: EM | Admit: 2024-01-02 | Discharge: 2024-01-05 | DRG: 417 | Disposition: A | Discharge status: Home / Self Care | Attending: Internal Medicine | Admitting: Internal Medicine

## 2024-01-02 ENCOUNTER — Inpatient Hospital Stay (HOSPITAL_COMMUNITY)

## 2024-01-02 ENCOUNTER — Encounter (HOSPITAL_COMMUNITY): Payer: Self-pay

## 2024-01-02 DIAGNOSIS — I2510 Atherosclerotic heart disease of native coronary artery without angina pectoris: Secondary | ICD-10-CM | POA: Diagnosis not present

## 2024-01-02 DIAGNOSIS — I129 Hypertensive chronic kidney disease with stage 1 through stage 4 chronic kidney disease, or unspecified chronic kidney disease: Secondary | ICD-10-CM | POA: Diagnosis present

## 2024-01-02 DIAGNOSIS — K760 Fatty (change of) liver, not elsewhere classified: Secondary | ICD-10-CM | POA: Diagnosis not present

## 2024-01-02 DIAGNOSIS — K8010 Calculus of gallbladder with chronic cholecystitis without obstruction: Secondary | ICD-10-CM | POA: Diagnosis not present

## 2024-01-02 DIAGNOSIS — K8020 Calculus of gallbladder without cholecystitis without obstruction: Secondary | ICD-10-CM | POA: Diagnosis not present

## 2024-01-02 DIAGNOSIS — K812 Acute cholecystitis with chronic cholecystitis: Secondary | ICD-10-CM | POA: Diagnosis not present

## 2024-01-02 DIAGNOSIS — K851 Biliary acute pancreatitis without necrosis or infection: Secondary | ICD-10-CM | POA: Diagnosis not present

## 2024-01-02 DIAGNOSIS — E785 Hyperlipidemia, unspecified: Secondary | ICD-10-CM | POA: Diagnosis present

## 2024-01-02 DIAGNOSIS — Z8546 Personal history of malignant neoplasm of prostate: Secondary | ICD-10-CM

## 2024-01-02 DIAGNOSIS — Z9079 Acquired absence of other genital organ(s): Secondary | ICD-10-CM

## 2024-01-02 DIAGNOSIS — N179 Acute kidney failure, unspecified: Secondary | ICD-10-CM | POA: Diagnosis not present

## 2024-01-02 DIAGNOSIS — N1831 Chronic kidney disease, stage 3a: Secondary | ICD-10-CM | POA: Diagnosis present

## 2024-01-02 DIAGNOSIS — Z79899 Other long term (current) drug therapy: Secondary | ICD-10-CM | POA: Diagnosis not present

## 2024-01-02 DIAGNOSIS — K8013 Calculus of gallbladder with acute and chronic cholecystitis with obstruction: Principal | ICD-10-CM | POA: Diagnosis present

## 2024-01-02 DIAGNOSIS — Z8042 Family history of malignant neoplasm of prostate: Secondary | ICD-10-CM | POA: Diagnosis not present

## 2024-01-02 DIAGNOSIS — K859 Acute pancreatitis without necrosis or infection, unspecified: Secondary | ICD-10-CM | POA: Diagnosis not present

## 2024-01-02 LAB — COMPREHENSIVE METABOLIC PANEL WITH GFR
ALT: 222 U/L — ABNORMAL HIGH (ref 0–44)
AST: 168 U/L — ABNORMAL HIGH (ref 15–41)
Albumin: 2.8 g/dL — ABNORMAL LOW (ref 3.5–5.0)
Alkaline Phosphatase: 255 U/L — ABNORMAL HIGH (ref 38–126)
Anion gap: 11 (ref 5–15)
BUN: 10 mg/dL (ref 8–23)
CO2: 22 mmol/L (ref 22–32)
Calcium: 8.6 mg/dL — ABNORMAL LOW (ref 8.9–10.3)
Chloride: 104 mmol/L (ref 98–111)
Creatinine, Ser: 1.34 mg/dL — ABNORMAL HIGH (ref 0.61–1.24)
GFR, Estimated: 54 mL/min — ABNORMAL LOW (ref >60)
Glucose, Bld: 114 mg/dL — ABNORMAL HIGH (ref 70–99)
Potassium: 4.5 mmol/L (ref 3.5–5.1)
Sodium: 137 mmol/L (ref 135–145)
Total Bilirubin: 4.8 mg/dL — ABNORMAL HIGH (ref 0.0–1.2)
Total Protein: 6.3 g/dL — ABNORMAL LOW (ref 6.5–8.1)

## 2024-01-02 LAB — CBC
HCT: 38.6 % — ABNORMAL LOW (ref 39.0–52.0)
Hemoglobin: 12.7 g/dL — ABNORMAL LOW (ref 13.0–17.0)
MCH: 32.2 pg (ref 26.0–34.0)
MCHC: 32.9 g/dL (ref 30.0–36.0)
MCV: 98 fL (ref 80.0–100.0)
Platelets: 211 K/uL (ref 150–400)
RBC: 3.94 MIL/uL — ABNORMAL LOW (ref 4.22–5.81)
RDW: 13.5 % (ref 11.5–15.5)
WBC: 9 K/uL (ref 4.0–10.5)
nRBC: 0 % (ref 0.0–0.2)

## 2024-01-02 LAB — LIPID PANEL
Cholesterol: 123 mg/dL (ref 0–200)
HDL: 26 mg/dL — ABNORMAL LOW (ref >40)
LDL Cholesterol: 87 mg/dL (ref 0–99)
Total CHOL/HDL Ratio: 4.7 ratio
Triglycerides: 49 mg/dL (ref <150)
VLDL: 10 mg/dL (ref 0–40)

## 2024-01-02 LAB — HEPATITIS PANEL, ACUTE
HCV Ab: NONREACTIVE
Hep A IgM: NONREACTIVE
Hep B C IgM: NONREACTIVE
Hepatitis B Surface Ag: NONREACTIVE

## 2024-01-02 LAB — BILIRUBIN, FRACTIONATED(TOT/DIR/INDIR)
Bilirubin, Direct: 2.6 mg/dL — ABNORMAL HIGH (ref 0.0–0.2)
Indirect Bilirubin: 2.8 mg/dL — ABNORMAL HIGH (ref 0.3–0.9)
Total Bilirubin: 5.4 mg/dL — ABNORMAL HIGH (ref 0.0–1.2)

## 2024-01-02 LAB — LIPASE, BLOOD: Lipase: 461 U/L — ABNORMAL HIGH (ref 11–51)

## 2024-01-02 LAB — GAMMA GT: GGT: 248 U/L — ABNORMAL HIGH (ref 7–50)

## 2024-01-02 MED ORDER — LACTATED RINGERS IV SOLN: INTRAVENOUS | Status: DC

## 2024-01-02 MED ORDER — PANTOPRAZOLE SODIUM 40 MG IV SOLR
40.0000 mg | Freq: Two times a day (BID) | INTRAVENOUS | Status: DC
  Administered 2024-01-02 – 2024-01-05 (×7): 40 mg via INTRAVENOUS
  Filled 2024-01-02 (×7): qty 10

## 2024-01-02 MED ORDER — ACETAMINOPHEN 650 MG RE SUPP: 650.0000 mg | Freq: Four times a day (QID) | RECTAL | Status: DC | PRN

## 2024-01-02 MED ORDER — MORPHINE SULFATE (PF) 4 MG/ML IV SOLN
4.0000 mg | Freq: Once | INTRAVENOUS | Status: AC | PRN
  Administered 2024-01-02: 4 mg via INTRAVENOUS
  Filled 2024-01-02: qty 1

## 2024-01-02 MED ORDER — LACTATED RINGERS IV BOLUS
1000.0000 mL | Freq: Once | INTRAVENOUS | Status: AC
  Administered 2024-01-02: 1000 mL via INTRAVENOUS

## 2024-01-02 MED ORDER — HYDRALAZINE HCL 20 MG/ML IJ SOLN
5.0000 mg | INTRAMUSCULAR | Status: DC | PRN
  Administered 2024-01-05: 5 mg via INTRAVENOUS
  Filled 2024-01-02: qty 1

## 2024-01-02 MED ORDER — ACETAMINOPHEN 325 MG PO TABS
650.0000 mg | ORAL_TABLET | Freq: Four times a day (QID) | ORAL | Status: DC | PRN
  Administered 2024-01-02 – 2024-01-05 (×6): 650 mg via ORAL
  Filled 2024-01-02 (×6): qty 2

## 2024-01-02 MED ORDER — ONDANSETRON HCL 4 MG/2ML IJ SOLN
4.0000 mg | Freq: Four times a day (QID) | INTRAMUSCULAR | Status: DC | PRN
  Administered 2024-01-02: 4 mg via INTRAVENOUS
  Filled 2024-01-02: qty 2

## 2024-01-02 NOTE — ED Triage Notes (Signed)
 Pt last took 200mg  ibuprofen and 1 oxycodone around 2 am

## 2024-01-02 NOTE — Consult Note (Signed)
 Reason for Consult/Chief Complaint: biliary pancreatitis Consultant: Barrett, PA  Michael Turner is an 78 y.o. male.   HPI: 71M with known biliary pancreatitis who was discharged with a plan for interval chole per patient request presents again today for recurrent biliary colic symptoms. Associated emesis last PM. Last BM 10/18. Last colonoscopy Jan 2024  Past Medical History:  Diagnosis Date   Bladder neck obstruction 2015   Cancer of prostate with intermediate recurrence risk (stage T2b-c or Gleason 7 or PSA 10-20) (HCC) 2015   Elevated prostate specific antigen (PSA) 2015   Hernia of anterior abdominal wall 2015   Malignant tumor of prostate (HCC) 2015   Multiple closed anterior-posterior compression fractures of pelvis (HCC)    Post-traumatic anterior urethral stricture 2015    Past Surgical History:  Procedure Laterality Date   CYSTOSCOPY  01/05/2014   INSERTION OF MESH Right 08/12/2017   Procedure: INSERTION OF MESH;  Surgeon: Belinda Cough, MD;  Location: [facility];  Service: General;  Laterality: Right;   INTERNAL URETHROTOMY  01/05/2014   PARASTOMAL HERNIA REPAIR Right 08/12/2017   Procedure: VENTRAL INCISIONAL HERNIA REPAIR WITH MESH;  Surgeon: Belinda Cough, MD;  Location: [facility];  Service: General;  Laterality: Right;   PROSTATE BIOPSY  06/24/2013   Dr. Tanda Jama Moats III   ROBOT ASSISTED LAPAROSCOPIC RADICAL PROSTATECTOMY  10/11/2013   [facility]    Family History  Problem Relation Age of Onset   Cancer Brother        younger brother/prostate ca/tx with combo chemo, adt, radiation   Cancer Maternal Grandmother        bladder    Social History:  reports that he has never smoked. He has never used smokeless tobacco. He reports current alcohol  use of about 2.0 - 3.0 standard drinks of alcohol  per week. He reports that he does not use drugs.  Allergies: No Known Allergies  Medications: I have reviewed the patient's  current medications.  Results for orders placed or performed during the hospital encounter of 01/02/24 (from the past 48 hours)  Lipase, blood     Status: Abnormal   Collection Time: 01/02/24 10:01 AM  Result Value Ref Range   Lipase 461 (H) 11 - 51 U/L    Comment: RESULT CONFIRMED BY MANUAL DILUTION Performed at [facility], [address] [place], [place] [address]   Comprehensive metabolic panel     Status: Abnormal   Collection Time: 01/02/24 10:01 AM  Result Value Ref Range   Sodium 137 135 - 145 mmol/L   Potassium 4.5 3.5 - 5.1 mmol/L   Chloride 104 98 - 111 mmol/L   CO2 22 22 - 32 mmol/L   Glucose, Bld 114 (H) 70 - 99 mg/dL    Comment: Glucose reference range applies only to samples taken after fasting for at least 8 hours.   BUN 10 8 - 23 mg/dL   Creatinine, Ser 8.65 (H) 0.61 - 1.24 mg/dL   Calcium 8.6 (L) 8.9 - 10.3 mg/dL   Total Protein 6.3 (L) 6.5 - 8.1 g/dL   Albumin 2.8 (L) 3.5 - 5.0 g/dL   AST 831 (H) 15 - 41 U/L   ALT 222 (H) 0 - 44 U/L   Alkaline Phosphatase 255 (H) 38 - 126 U/L   Total Bilirubin 4.8 (H) 0.0 - 1.2 mg/dL   GFR, Estimated 54 (L) >60 mL/min    Comment: (NOTE) Calculated using the CKD-EPI Creatinine Equation (  2021)    Anion gap 11 5 - 15    Comment: Performed at [facility], [address], [place], [place] [address]  CBC     Status: Abnormal   Collection Time: 01/02/24 10:01 AM  Result Value Ref Range   WBC 9.0 4.0 - 10.5 K/uL   RBC 3.94 (L) 4.22 - 5.81 MIL/uL   Hemoglobin 12.7 (L) 13.0 - 17.0 g/dL   HCT 61.3 (L) 60.9 - 47.9 %   MCV 98.0 80.0 - 100.0 fL   MCH 32.2 26.0 - 34.0 pg   MCHC 32.9 30.0 - 36.0 g/dL   RDW 86.4 88.4 - 84.4 %   Platelets 211 150 - 400 K/uL   nRBC 0.0 0.0 - 0.2 %    Comment: Performed at [facility], [address] [place], [place] [address]    No results found.  ROS 10 point review of systems is negative except as listed above in HPI.   Physical Exam Blood pressure (!) 159/74, pulse 91,  temperature 99.6 F (37.6 C), temperature source Oral, resp. rate 14, height 5' 9 (1.753 m), weight 83.6 kg, SpO2 100%. Constitutional: well-developed, well-nourished HEENT: pupils equal, round, reactive to light, 2mm b/l, moist conjunctiva, external inspection of ears and nose normal, hearing intact Oropharynx: normal oropharyngeal mucosa, normal dentition Neck: no thyromegaly, trachea midline, no midline cervical tenderness to palpation Chest: breath sounds equal bilaterally, normal respiratory effort, no midline or lateral chest wall tenderness to palpation/deformity Abdomen: soft, NT, no bruising, no hepatosplenomegaly GU: no blood at urethral meatus of penis, no scrotal masses or abnormality  Skin: warm, dry, no rashes Psych: normal memory, normal mood/affect     Assessment/Plan: Biliary pancreatitis - Tbili on the day of discharge was 5.2, today is 4.8, no biliary ductal dillation on MRI 10/14, but will get RUQ US  to assess CBD size and r/o CBD obstruction. Will plan for lap chole 10/19 vs 10/20 pending lipase trend.  FEN - CLD DVT - SCDs, LMWH Dispo - per primary team    Michael GEANNIE Hanger, MD General and Trauma Surgery [facility]

## 2024-01-02 NOTE — ED Provider Notes (Signed)
 [place] EMERGENCY DEPARTMENT AT [facility] Provider Note   CSN: [id number] Arrival date & time: 01/02/24  9051     Patient presents with: Abdominal Pain   Michael Turner is a 78 y.o. male.  Patient with past medical history of malignant tumor of the prostate and gallstone pancreatitis presents to emergency room with complaint of abdominal pain.  Patient locates his pain to his right upper quadrant.  He reports that this started last night and was approximately 6 hours of severe pain and it did improve with small amount of ibuprofen and 1 oxycodone.  He called the on-call general surgeon who recommended he come to ER for admission and likely to have gallbladder removed.  He denies any fever chest pain shortness of breath or cough.    Abdominal Pain      Prior to Admission medications   Medication Sig Start Date End Date Taking? Authorizing Provider  atorvastatin (LIPITOR) 40 MG tablet Take 40 mg by mouth daily.   Yes [provider]  Cholecalciferol (VITAMIN D-1000 MAX ST) 1000 units tablet Take 1,000 Units by mouth daily.   Yes [provider]  ezetimibe (ZETIA) 10 MG tablet Take 10 mg by mouth daily. 12/19/23  Yes [provider]  famotidine-calcium carbonate-magnesium hydroxide (PEPCID COMPLETE) 10-800-165 MG chewable tablet Chew 1 tablet by mouth daily as needed.   Yes [provider]  ibuprofen (ADVIL) 200 MG tablet Take 200 mg by mouth every 6 (six) hours as needed.   Yes [provider]  losartan (COZAAR) 100 MG tablet Take 100 mg by mouth daily.   Yes [provider]  Multiple Vitamins-Minerals (CENTRUM SILVER 50+MEN PO) Take 1 tablet by mouth daily.   Yes [provider]  omeprazole (PRILOSEC) 10 MG capsule Take 10 mg by mouth daily.   Yes [provider]  oxycodone (OXY-IR) 5 MG capsule Take 1 capsule (5 mg total) by mouth every 4 (four) hours as needed. 12/30/23  Yes Fausto Burnard LABOR, DO     Allergies: Patient has no known allergies.    Review of Systems  Gastrointestinal:  Positive for abdominal pain.    Updated Vital Signs BP (!) 159/74 (BP Location: Right Arm)   Pulse 91   Temp 99.6 F (37.6 C) (Oral)   Resp 14   Ht 5' 9 (1.753 m)   Wt 83.6 kg   SpO2 100%   BMI 27.22 kg/m   Physical Exam Vitals and nursing note reviewed.  Constitutional:      General: He is not in acute distress.    Appearance: He is not toxic-appearing.  HENT:     Head: Normocephalic and atraumatic.  Eyes:     General: No scleral icterus.    Conjunctiva/sclera: Conjunctivae normal.  Cardiovascular:     Rate and Rhythm: Normal rate and regular rhythm.     Pulses: Normal pulses.     Heart sounds: Normal heart sounds.  Pulmonary:     Effort: Pulmonary effort is normal. No respiratory distress.     Breath sounds: Normal breath sounds.  Abdominal:     General: Abdomen is flat. Bowel sounds are normal.     Palpations: Abdomen is soft.     Tenderness: There is abdominal tenderness in the right upper quadrant.  Skin:    General: Skin is warm and dry.     Findings: No lesion.  Neurological:     General: No focal deficit present.     Mental Status:  He is alert and oriented to person, place, and time. Mental status is at baseline.     (all labs ordered are listed, but only abnormal results are displayed) Labs Reviewed  LIPASE, BLOOD - Abnormal; Notable for the following components:      Result Value   Lipase 461 (*)    All other components within normal limits  COMPREHENSIVE METABOLIC PANEL WITH GFR - Abnormal; Notable for the following components:   Glucose, Bld 114 (*)    Creatinine, Ser 1.34 (*)    Calcium 8.6 (*)    Total Protein 6.3 (*)    Albumin 2.8 (*)    AST 168 (*)    ALT 222 (*)    Alkaline Phosphatase 255 (*)    Total Bilirubin 4.8 (*)    GFR, Estimated 54 (*)    All other components within normal limits  CBC - Abnormal; Notable for the following components:    RBC 3.94 (*)    Hemoglobin 12.7 (*)    HCT 38.6 (*)    All other components within normal limits  BILIRUBIN, FRACTIONATED(TOT/DIR/INDIR)  LIPID PANEL  GAMMA GT  HEPATITIS PANEL, ACUTE    EKG: None  Radiology: No results found.   Procedures   Medications Ordered in the ED  lactated ringers  bolus 1,000 mL (has no administration in time range)  lactated ringers  infusion (has no administration in time range)  morphine (PF) 4 MG/ML injection 4 mg (has no administration in time range)  ondansetron  (ZOFRAN ) injection 4 mg (has no administration in time range)  lactated ringers  infusion (has no administration in time range)  acetaminophen  (TYLENOL ) tablet 650 mg (has no administration in time range)    Or  acetaminophen  (TYLENOL ) suppository 650 mg (has no administration in time range)  hydrALAZINE (APRESOLINE) injection 5 mg (has no administration in time range)  pantoprazole (PROTONIX) injection 40 mg (has no administration in time range)    Clinical Course as of 01/02/24 1357  Sat Jan 02, 2024  1326 Spoke with Dr. Tobie with hospitalist for admission.  Recommending getting GI involved.  I will consult GI as well as general surgery. [JB]    Clinical Course User Index [JB] Frederik Standley, Warren SAILOR, PA-C                                 Medical Decision Making Amount and/or Complexity of Data Reviewed Labs: ordered.  Risk Prescription drug management. Decision regarding hospitalization.   This patient presents to the ED for concern of abdominal pain, this involves an extensive number of treatment options, and is a complaint that carries with it a high risk of complications and morbidity.  The differential diagnosis includes cholecystitis, AAA, appendicitis, renal stone, UTI   Co morbidities that complicate the patient evaluation  Gallstone pancreatitis   Additional history obtained:  Additional history obtained from recent discharge summary from hospital stay with Dr.  Signa, patient was discharged with outpatient follow-up and with gallstone pancreatitis   Lab Tests:  I personally interpreted labs.  The pertinent results include:   No leukocytosis.  Hemoglobin is 12.7 CMP is remarkable for elevation in AST, ALT, alk phos and total bilirubin.  Total bilirubin is 4.5 will add on fractionated bilirubin Lipase is 461   Cardiac Monitoring: / EKG:  The patient was maintained on a cardiac monitor.     Consultations Obtained:  I requested consultation with the general surgery Dr Weston  as this is the person Darius spoke to this morning PTA and discussed lab they recommend: Medicine admission and cholecystectomy, recommending getting [facility] Surgery involved.  General surgery Dr. Paola will see patient Recommending RUQ US  which is ordered and medicine admission. I did consult hospitalist for admission, Dr. Tobie agrees to see patient for admission.   Problem List / ED Course / Critical interventions / Medication management  Patient presents with intermittent right upper quadrant pain ongoing for weeks.  Today he presents with severe constant right upper quadrant pain associate with nausea and vomiting.  Patient's labs show elevation in AST, ALT and alk phos as well as a total bilirubin of 4.8 and lipase is elevated at 461.  On my exam he is no longer having any focal point of tenderness.  He reports he took oxycodone which helped improve his pain.  His vital signs are stable and he is well-appearing.  I have placed order for as needed Zofran  and as needed morphine in case this changes.  Started him on fluid bolus and maintenance fluids.  Encouraged him to remain n.p.o. until evaluated by general surgery here patient is agreeable.  He will be admitted for his gallstone pancreatitis.       Final diagnoses:  Gallstone pancreatitis    ED Discharge Orders     None          Shermon Warren SAILOR, PA-C 01/02/24 1358    Rogelia Jerilynn RAMAN,  MD 01/02/24 1544

## 2024-01-02 NOTE — H&P (Signed)
 History and Physical    Patient: Michael Turner FMW:[id number] DOB: 02/28/1946 DOA: 01/02/2024 DOS: the patient was seen and examined on 01/02/2024 . PCP: Shayne Anes, MD  Patient coming from: Home Chief complaint: Chief Complaint  Patient presents with   Abdominal Pain   HPI:  Michael Turner is a 78 y.o. male with past medical history  of HTN, HLD, and prostate cancer (Gleason 7; s/p robotic prostatectomy) recently discharged on the 15th and was admitted on the 14th with similar presentation of abdominal flank pain found to have acute biliary pancreatitis.  Patient has a history of Gilbert's disease as well.  Wife reports that during his last recent admission he was offered elective surgery which he has scheduled for November but with the pain that is been ongoing over the past day he has had disabling pain 5 episodes nausea vomiting epigastric abdominal pain and they do not think that he can wait till November.  Patient at bedside is currently stable and no pain and no distress.  General surgery has been consulted by EDMD.   ED Course:  Vital signs in the ED were notable for the following:  Vitals:   01/02/24 0953 01/02/24 0956 01/02/24 1248  BP: (!) 158/70  (!) 159/74  Pulse: (!) 102  91  Temp: 98.6 F (37 C)  99.6 F (37.6 C)  Resp: (!) 21  14  Height:  5' 9 (1.753 m)   Weight:  83.6 kg   SpO2: 97%  100%  TempSrc:   Oral  BMI (Calculated):  27.2   >>ED evaluation thus far shows: CMP shows glucose 114 AKI with a creatinine of 1.34 eGFR 54 calcium 8.6 abnormal LFTs with alk phos of 255 albumin 2.8 lipase 461 AST 168 ALT 222 total bili 4.8. CBC showing normal white count of 9.0 hemoglobin of 12.7 and platelets of 211.  >>While in the ED patient received the following: Medications  lactated ringers  bolus 1,000 mL (has no administration in time range)  lactated ringers  infusion (has no administration in time range)  morphine (PF) 4 MG/ML injection 4 mg (has no administration  in time range)  ondansetron  (ZOFRAN ) injection 4 mg (has no administration in time range)   Review of Systems  Gastrointestinal:  Positive for abdominal pain, nausea and vomiting.   Past Medical History:  Diagnosis Date   Bladder neck obstruction 2015   Cancer of prostate with intermediate recurrence risk (stage T2b-c or Gleason 7 or PSA 10-20) (HCC) 2015   Elevated prostate specific antigen (PSA) 2015   Hernia of anterior abdominal wall 2015   Malignant tumor of prostate (HCC) 2015   Multiple closed anterior-posterior compression fractures of pelvis (HCC)    Post-traumatic anterior urethral stricture 2015   Past Surgical History:  Procedure Laterality Date   CYSTOSCOPY  01/05/2014   INSERTION OF MESH Right 08/12/2017   Procedure: INSERTION OF MESH;  Surgeon: Belinda Cough, MD;  Location: [facility];  Service: General;  Laterality: Right;   INTERNAL URETHROTOMY  01/05/2014   PARASTOMAL HERNIA REPAIR Right 08/12/2017   Procedure: VENTRAL INCISIONAL HERNIA REPAIR WITH MESH;  Surgeon: Belinda Cough, MD;  Location: [facility];  Service: General;  Laterality: Right;   PROSTATE BIOPSY  06/24/2013   Dr. Tanda Jama Moats III   ROBOT ASSISTED LAPAROSCOPIC RADICAL PROSTATECTOMY  10/11/2013   [facility]    reports that he has never smoked. He has never used smokeless tobacco. He reports current alcohol  use of  about 2.0 - 3.0 standard drinks of alcohol  per week. He reports that he does not use drugs. No Known Allergies Family History  Problem Relation Age of Onset   Cancer Brother        younger brother/prostate ca/tx with combo chemo, adt, radiation   Cancer Maternal Grandmother        bladder   Prior to Admission medications   Medication Sig Start Date End Date Taking? Authorizing Provider  atorvastatin (LIPITOR) 40 MG tablet Take 40 mg by mouth daily.   Yes [provider]  Cholecalciferol (VITAMIN D-1000 MAX ST) 1000 units tablet Take 1,000  Units by mouth daily.   Yes [provider]  ezetimibe (ZETIA) 10 MG tablet Take 10 mg by mouth daily. 12/19/23  Yes [provider]  losartan (COZAAR) 100 MG tablet Take 100 mg by mouth daily.   Yes [provider]  Multiple Vitamins-Minerals (CENTRUM SILVER 50+MEN PO) Take 1 tablet by mouth daily.   Yes [provider]  omeprazole (PRILOSEC) 10 MG capsule Take 10 mg by mouth daily.   Yes [provider]  oxycodone (OXY-IR) 5 MG capsule Take 1 capsule (5 mg total) by mouth every 4 (four) hours as needed. 12/30/23   Fausto Burnard LABOR, DO                                                                                 Vitals:   01/02/24 0953 01/02/24 0956 01/02/24 1248  BP: (!) 158/70  (!) 159/74  Pulse: (!) 102  91  Resp: (!) 21  14  Temp: 98.6 F (37 C)  99.6 F (37.6 C)  TempSrc:   Oral  SpO2: 97%  100%  Weight:  83.6 kg   Height:  5' 9 (1.753 m)    Physical Exam Vitals reviewed.  Constitutional:      General: He is not in acute distress.    Appearance: He is ill-appearing.  HENT:     Head: Normocephalic.  Eyes:     Extraocular Movements: Extraocular movements intact.  Cardiovascular:     Rate and Rhythm: Normal rate and regular rhythm.     Heart sounds: Normal heart sounds.  Pulmonary:     Breath sounds: Normal breath sounds.  Abdominal:     General: Bowel sounds are decreased. There is no distension.     Palpations: Abdomen is soft.     Tenderness: There is abdominal tenderness in the periumbilical area.  Neurological:     General: No focal deficit present.     Mental Status: He is alert and oriented to person, place, and time.     Labs on Admission: I have personally reviewed following labs and imaging studies CBC: Recent Labs  Lab 12/29/23 0424 01/02/24 1001  WBC 3.8* 9.0  HGB 13.8 12.7*  HCT 42.0 38.6*  MCV 97.2 98.0  PLT 198 211   Basic Metabolic Panel: Recent Labs  Lab 12/29/23 0424 12/30/23 0430  01/02/24 1001  NA 141 138 137  K 4.2 4.3 4.5  CL 105 107 104  CO2 24 24 22   GLUCOSE 163* 101* 114*  BUN 20 20 10   CREATININE 1.63* 1.49* 1.34*  CALCIUM 9.1 7.6* 8.6*   GFR: Estimated Creatinine Clearance: 45.4 mL/min (A) (by C-G formula based on SCr of 1.34 mg/dL (H)). Liver Function Tests: Recent Labs  Lab 12/29/23 0424 12/30/23 0430 01/02/24 1001  AST 650* 367* 168*  ALT 342* 473* 222*  ALKPHOS 129* 90 255*  BILITOT 2.7* 5.2* 4.8*  PROT 6.6 5.2* 6.3*  ALBUMIN 3.5 2.5* 2.8*   Recent Labs  Lab 12/29/23 0424 12/30/23 0430 01/02/24 1001  LIPASE 420* 458* 461*   No results for input(s): AMMONIA in the last 168 hours. Recent Labs    12/29/23 0424 12/30/23 0430 01/02/24 1001  BUN 20 20 10   CREATININE 1.63* 1.49* 1.34*    Cardiac Enzymes: No results for input(s): CKTOTAL, CKMB, CKMBINDEX, TROPONINI in the last 168 hours. BNP (last 3 results) No results for input(s): PROBNP in the last 8760 hours. HbA1C: No results for input(s): HGBA1C in the last 72 hours. CBG: No results for input(s): GLUCAP in the last 168 hours. Lipid Profile: No results for input(s): CHOL, HDL, LDLCALC, TRIG, CHOLHDL, LDLDIRECT in the last 72 hours. Thyroid  Function Tests: No results for input(s): TSH, T4TOTAL, FREET4, T3FREE, THYROIDAB in the last 72 hours. Anemia Panel: No results for input(s): VITAMINB12, FOLATE, FERRITIN, TIBC, IRON, RETICCTPCT in the last 72 hours. Urine analysis:    Component Value Date/Time   COLORURINE AMBER (A) 12/29/2023 0429   APPEARANCEUR CLEAR 12/29/2023 0429   LABSPEC 1.026 12/29/2023 0429   PHURINE 5.0 12/29/2023 0429   GLUCOSEU NEGATIVE 12/29/2023 0429   HGBUR NEGATIVE 12/29/2023 0429   BILIRUBINUR NEGATIVE 12/29/2023 0429   KETONESUR NEGATIVE 12/29/2023 0429   PROTEINUR 30 (A) 12/29/2023 0429   NITRITE NEGATIVE 12/29/2023 0429   LEUKOCYTESUR NEGATIVE 12/29/2023 0429   Radiological Exams on  Admission: No results found. Data Reviewed: Relevant notes from primary care and specialist visits, past discharge summaries as available in EHR, including Care Everywhere . Prior diagnostic testing as pertinent to current admission diagnoses, Updated medications and problem lists for reconciliation .ED course, including vitals, labs, imaging, treatment and response to treatment,Triage notes, nursing and pharmacy notes and ED provider's notes.Notable results as noted in HPI.Discussed case with EDMD/ ED APP/ or Specialty MD on call and as needed.  Assessment & Plan  >> Abdominal pain/acute gallstone pancreatitis patient found to have cholelithiasis on MRCP: We will admit patient to telemetry unit with continuous cardiac monitoring.  Lipid panel and GGT pending. N.p.o. except for meds, bowel rest, IV PPI, aspiration precaution, pain control.   >> Transaminitis: Likely hepatocellular injury secondary to transient common bile duct obstruction from a passed or retained stone. Will monitor patient's LFTs INR albumin CBC. Acute hepatitis panel.  GI consult.    Latest Ref Rng & Units 01/02/2024   10:01 AM 12/30/2023    4:30 AM 12/29/2023    4:24 AM  Hepatic Function  Total Protein 6.5 - 8.1 g/dL 6.3  5.2  6.6   Albumin 3.5 - 5.0 g/dL 2.8  2.5  3.5   AST 15 - 41 U/L 168  367  650   ALT 0 - 44 U/L 222  473  342   Alk Phosphatase 38 - 126 U/L 255  90  129   Total Bilirubin 0.0 - 1.2 mg/dL 4.8  5.2  2.7      >> Essential hypertension: Vitals:   01/02/24 0953 01/02/24 1248  BP: (!) 158/70 (!) 159/74  PTA meds include losartan 100 mg which we will currently hold as needed hydralazine and  pain control.   >> Hyperlipidemia: PTA meds include atorvastatin and ezetimibe which I will hold.  >> AKI: Lab Results  Component Value Date   CREATININE 1.34 (H) 01/02/2024   CREATININE 1.49 (H) 12/30/2023   CREATININE 1.63 (H) 12/29/2023  Avoid contrast will currently hold patient's ARB therapy  continue with IV fluids.   DVT prophylaxis:  SCDs Consults:  General surgery   Advance Care Planning:    Code Status: Full Code   Family Communication:  None.  Disposition Plan:  Home.   Severity of Illness: The appropriate patient status for this patient is INPATIENT. Inpatient status is judged to be reasonable and necessary in order to provide the required intensity of service to ensure the patient's safety. The patient's presenting symptoms, physical exam findings, and initial radiographic and laboratory data in the context of their chronic comorbidities is felt to place them at high risk for further clinical deterioration. Furthermore, it is not anticipated that the patient will be medically stable for discharge from the hospital within 2 midnights of admission.   * I certify that at the point of admission it is my clinical judgment that the patient will require inpatient hospital care spanning beyond 2 midnights from the point of admission due to high intensity of service, high risk for further deterioration and high frequency of surveillance required.*  Unresulted Labs (From admission, onward)     Start     Ordered   01/03/24 0500  Comprehensive metabolic panel  Tomorrow morning,   R        01/02/24 1335   01/03/24 0500  CBC  Tomorrow morning,   R        01/02/24 1335   01/03/24 0500  Protime-INR  Tomorrow morning,   R        01/02/24 1335   01/03/24 0500  APTT  Tomorrow morning,   R        01/02/24 1335   01/02/24 1357  Hepatitis panel, acute  Add-on,   AD        01/02/24 1356   01/02/24 1319  Lipid panel  Add-on,   AD        01/02/24 1320   01/02/24 1319  Gamma GT  Add-on,   AD        01/02/24 1320   01/02/24 1140  Bilirubin, fractionated(tot/dir/indir)  Add-on,   AD        01/02/24 1139            Meds ordered this encounter  Medications   lactated ringers  bolus 1,000 mL   lactated ringers  infusion   morphine (PF) 4 MG/ML injection 4 mg   ondansetron   (ZOFRAN ) injection 4 mg   lactated ringers  infusion   OR Linked Order Group    acetaminophen  (TYLENOL ) tablet 650 mg    acetaminophen  (TYLENOL ) suppository 650 mg   hydrALAZINE (APRESOLINE) injection 5 mg   pantoprazole (PROTONIX) injection 40 mg     Orders Placed This Encounter  Procedures   US  Abdomen Limited RUQ (LIVER/GB)   Lipase, blood   Comprehensive metabolic panel   CBC   Bilirubin, fractionated(tot/dir/indir)   Lipid panel   Gamma GT   Comprehensive metabolic panel   CBC   Protime-INR   APTT   Hepatitis panel, acute   Diet NPO time specified Except for: Sips with Meds, Ice Chips   SCDs   Cardiac Monitoring Continuous x 24 hours Indications for use: Other; other indications for use: Monitor for ischemia  Vital signs   Notify physician (specify)   Refer to Sidebar Report Mobility Protocol for Adult Inpatient   Initiate Adult Central Line Maintenance and Catheter Protocol for patients with central line (CVC, PICC, Port, Hemodialysis, Trialysis)   If patient diabetic or glucose greater than 140 notify physician for Sliding Scale Insulin Orders   Intake and Output   Initiate CHG Protocol   Do not place and if present remove PureWick   Initiate Oral Care Protocol   Initiate Carrier Fluid Protocol   RN may order General Admission PRN Orders utilizing General Admission PRN medications (through manage orders) for the following patient needs: allergy symptoms (Claritin), cold sores (Carmex), cough (Robitussin DM), eye irritation (Liquifilm Tears), hemorrhoids (Tucks), indigestion (Maalox), minor skin irritation (Hydrocortisone Cream), muscle pain Lucienne Gay), nose irritation (saline nasal spray) and sore throat (Chloraseptic spray).   Ambulate with assistance   Measure blood pressure   Full code   Consult to general surgery   Consult to hospitalist   Consult to gastroenterology   Pulse oximetry check with vital signs   Oxygen therapy Mode or (Route): Nasal cannula; Liters  Per Minute: 2; Keep O2 saturation between: greater than 92 %   Incentive spirometry   Admit to Inpatient (patient's expected length of stay will be greater than 2 midnights or inpatient only procedure)   Aspiration precautions   Fall precautions    Author: Mario LULLA Blanch, MD 12 pm- 8 pm. [facility]. 01/02/2024 1:58 PM Please note for any communication after hours contact [facility] Assigned provider on call on Amion.

## 2024-01-02 NOTE — ED Provider Triage Note (Signed)
 Emergency Medicine Provider Triage Evaluation Note  Michael Turner , a 78 y.o. male  was evaluated in triage.  Pt complains of recurrent biliary colic that started last night and lasted for 9 hours despite taking pain medication at home.  Pain is now finally down to about a 1 but he has not eaten anything.  Spoke with surgery this morning who recommended he come to the emergency room.  Review of Systems  Positive: Abdominal pain, vomiting, Negative: Chest pain or shortness of breath  Physical Exam  BP (!) 158/70 (BP Location: Right Arm)   Pulse (!) 102   Temp 98.6 F (37 C)   Resp (!) 21   Ht 5' 9 (1.753 m)   Wt 83.6 kg   SpO2 97%   BMI 27.22 kg/m  Gen:   Awake, no distress   Resp:  Normal effort  MSK:   Moves extremities without difficulty  Other:  Mild firmness and discomfort in the right upper quadrant  Medical Decision Making  Medically screening exam initiated at 11:33 AM.  Appropriate orders placed.  Michael Turner was informed that the remainder of the evaluation will be completed by another provider, this initial triage assessment does not replace that evaluation, and the importance of remaining in the ED until their evaluation is complete. Poke with Dr. Ebbie who feels that patient does not need any further imaging unless another ERCP or MRCP is needed.  Feels that patient will need admission and likely cholecystectomy.    Doretha Folks, MD 01/02/24 1137

## 2024-01-02 NOTE — ED Triage Notes (Signed)
 Pt states he is having a gallstone attack and was told to come to the ED to have labs drawn to have emergency surgery. Had a 6 hr attack last night.

## 2024-01-03 DIAGNOSIS — N179 Acute kidney failure, unspecified: Secondary | ICD-10-CM | POA: Diagnosis not present

## 2024-01-03 DIAGNOSIS — K851 Biliary acute pancreatitis without necrosis or infection: Secondary | ICD-10-CM | POA: Diagnosis not present

## 2024-01-03 LAB — APTT: aPTT: 30 s (ref 24–36)

## 2024-01-03 LAB — COMPREHENSIVE METABOLIC PANEL WITH GFR
ALT: 200 U/L — ABNORMAL HIGH (ref 0–44)
AST: 162 U/L — ABNORMAL HIGH (ref 15–41)
Albumin: 2.4 g/dL — ABNORMAL LOW (ref 3.5–5.0)
Alkaline Phosphatase: 260 U/L — ABNORMAL HIGH (ref 38–126)
Anion gap: 10 (ref 5–15)
BUN: 10 mg/dL (ref 8–23)
CO2: 22 mmol/L (ref 22–32)
Calcium: 8.4 mg/dL — ABNORMAL LOW (ref 8.9–10.3)
Chloride: 103 mmol/L (ref 98–111)
Creatinine, Ser: 1.3 mg/dL — ABNORMAL HIGH (ref 0.61–1.24)
GFR, Estimated: 56 mL/min — ABNORMAL LOW (ref >60)
Glucose, Bld: 105 mg/dL — ABNORMAL HIGH (ref 70–99)
Potassium: 4.4 mmol/L (ref 3.5–5.1)
Sodium: 135 mmol/L (ref 135–145)
Total Bilirubin: 6 mg/dL — ABNORMAL HIGH (ref 0.0–1.2)
Total Protein: 5.7 g/dL — ABNORMAL LOW (ref 6.5–8.1)

## 2024-01-03 LAB — PROTIME-INR
INR: 0.9 (ref 0.8–1.2)
Prothrombin Time: 12.8 s (ref 11.4–15.2)

## 2024-01-03 LAB — CBC
HCT: 34 % — ABNORMAL LOW (ref 39.0–52.0)
Hemoglobin: 11.2 g/dL — ABNORMAL LOW (ref 13.0–17.0)
MCH: 32.3 pg (ref 26.0–34.0)
MCHC: 32.9 g/dL (ref 30.0–36.0)
MCV: 98 fL (ref 80.0–100.0)
Platelets: 161 K/uL (ref 150–400)
RBC: 3.47 MIL/uL — ABNORMAL LOW (ref 4.22–5.81)
RDW: 14 % (ref 11.5–15.5)
WBC: 4.4 K/uL (ref 4.0–10.5)
nRBC: 0 % (ref 0.0–0.2)

## 2024-01-03 LAB — LIPASE, BLOOD: Lipase: 517 U/L — ABNORMAL HIGH (ref 11–51)

## 2024-01-03 NOTE — Progress Notes (Signed)
 [facility] Progress Note     Subjective: CC:  No pain at present but having severe episodes of epigastric pain that last 4-6 hours. Associated with vomiting. Having some small caliber, solid, brown stools. Dark urine.   Denies a history of cardiac arrhythmia. Says he has had a low heart rate overnight and got 3 EKGs. Denies CP, dizziness.   Objective: Vital signs in last 24 hours: Temp:  [97.9 F (36.6 C)-99.7 F (37.6 C)] 98.7 F (37.1 C) (10/19 0814) Pulse Rate:  [58-122] 75 (10/19 0814) Resp:  [14-21] 17 (10/19 0814) BP: (134-159)/(47-83) 157/82 (10/19 0814) SpO2:  [97 %-100 %] 98 % (10/19 0814) Weight:  [83.6 kg] 83.6 kg (10/18 0956) Last BM Date : 01/01/24  Intake/Output from previous day: 10/18 0701 - 10/19 0700 In: 2318.8 [P.O.:720; I.V.:598.8; IV Piggyback:1000] Out: -  Intake/Output this shift: No intake/output data recorded.  PE: Gen:  Alert, NAD, cooperative Card:  Regular rate and rhythm Pulm:  Normal effort on room air Abd: Soft, non-tender, non-distended Skin: warm and dry, no rashes  Psych: A&Ox3   Lab Results:  Recent Labs    01/02/24 1001 01/03/24 0638  WBC 9.0 4.4  HGB 12.7* 11.2*  HCT 38.6* 34.0*  PLT 211 161   BMET Recent Labs    01/02/24 1001  NA 137  K 4.5  CL 104  CO2 22  GLUCOSE 114*  BUN 10  CREATININE 1.34*  CALCIUM 8.6*   PT/INR Recent Labs    01/03/24 0638  LABPROT 12.8  INR 0.9   CMP     Component Value Date/Time   NA 137 01/02/2024 1001   K 4.5 01/02/2024 1001   CL 104 01/02/2024 1001   CO2 22 01/02/2024 1001   GLUCOSE 114 (H) 01/02/2024 1001   BUN 10 01/02/2024 1001   CREATININE 1.34 (H) 01/02/2024 1001   CALCIUM 8.6 (L) 01/02/2024 1001   PROT 6.3 (L) 01/02/2024 1001   ALBUMIN 2.8 (L) 01/02/2024 1001   AST 168 (H) 01/02/2024 1001   ALT 222 (H) 01/02/2024 1001   ALKPHOS 255 (H) 01/02/2024 1001   BILITOT 5.4 (H) 01/02/2024 1840   GFRNONAA 54 (L) 01/02/2024 1001   Lipase     Component  Value Date/Time   LIPASE 461 (H) 01/02/2024 1001       Studies/Results: US  Abdomen Limited RUQ (LIVER/GB) Result Date: 01/02/2024 CLINICAL DATA:  Hyperbilirubinemia EXAM: ULTRASOUND ABDOMEN LIMITED RIGHT UPPER QUADRANT COMPARISON:  12/29/2023 FINDINGS: Gallbladder: Multiple small shadowing gallstones layer dependently within the gallbladder. No gallbladder wall thickening or pericholecystic fluid. There is a positive sonographic Murphy sign. Common bile duct: Diameter: 3 mm Liver: Diffuse increased liver echotexture compatible with hepatic steatosis. No focal liver abnormality or intrahepatic biliary duct dilation. Portal vein is patent on color Doppler imaging with normal direction of blood flow towards the liver. Other: None. IMPRESSION: 1. Cholelithiasis. There is a positive sonographic Murphy sign, but no other sonographic evidence of acute cholecystitis. If further evaluation is desired, nuclear medicine hepatobiliary scan could be considered. 2. Hepatic steatosis. Electronically Signed   By: Ozell Daring M.D.   On: 01/02/2024 15:12    Anti-infectives: Anti-infectives (From admission, onward)    None         Latest Ref Rng & Units 01/03/2024    6:38 AM 01/02/2024    6:40 PM 01/02/2024   10:01 AM  Hepatic Function  Total Protein 6.5 - 8.1 g/dL 5.7   6.3   Albumin 3.5 - 5.0  g/dL 2.4   2.8   AST 15 - 41 U/L 162   168   ALT 0 - 44 U/L 200   222   Alk Phosphatase 38 - 126 U/L 260   255   Total Bilirubin 0.0 - 1.2 mg/dL 6.0  5.4  4.8   Bilirubin, Direct 0.0 - 0.2 mg/dL  2.6      Assessment/Plan  Acute biliary pancreatitis - admitted 10/14-10/15 and wanted to go home to be with his wife for a procedure so was trying to get interval lap chole in 1-2 weeks as an outpatient but re-presented with worsening epigastric pain.  - of note he has chronic LFT derangement secondary to Hx of Gilbert's syndrome - MRCP 10/14 negative for choledocholithiasis  - re-presented 10/18 with LFT as  above, overall stable today but bili up some. - RUQ U/S yesterday with no biliary dilation - patient will benefit from lap chole with IOC, will discuss with MD; with rising bilirubin and normal ultrasound last night I'm not sure we need to repeat MRCP, may be reasonable to fractionate a bilirubin today or tomorrow morning. Patient is completely non-tender on abd exam at present which argues against both worsening pancreatitis and choledocholithiasis.  - lipase 461 yesterday, pending today    FEN: CLD, NPO MN VTE: LMWH ID: no current indication for abx   - per [facility] -   Hx of prostate cancer s/p prostatectomy and radiation  Hx of post-traumatic urethral stricture HTN CAD, stable without angina HLD Hx of Gilbert's syndrome CKD stage IIIa       LOS: 1 day   I reviewed nursing notes, hospitalist notes, last 24 h vitals and pain scores, last 48 h intake and output, last 24 h labs and trends, and last 24 h imaging results.  This care required moderate level of medical decision making.   Almarie Pringle, PA-C [facility] Please see Amion for pager number during day hours 7:00am-4:30pm

## 2024-01-03 NOTE — Hospital Course (Signed)
 12M hx of HTN, HLD, Gilbert's syndrome, and prostate cancer s/p robotic prostatectomy, recently discharged on the 15th for acute biliary pancreatitis. Was to follow up for op lap chole, now presenting with worsening abdominal pain.   Assessment and Plan:   Acute biliary pancreatitis - Liver enzymes appear to be downtrending from previous discharge however symptoms unresolving.  AST/ALT downtrending however lipase stable/slight uptrend.  Bilirubin increasing.  Originally scheduled for outpatient lap chole.  General surgery consulted and following closely.  RUQ ultrasound noting no biliary dilation but positive Murphy's sign.  Recommendations to pursue lap chole while inpatient.  Continue to monitor LFTs.  N.p.o. after midnight.   Hypertension - Hydralazine as needed.   Hyperlipidemia - Holding for now.   Acute kidney injury - Creatinine appears to show mild improvement.  IV fluids on board.  Will recheck BMP in AM.

## 2024-01-03 NOTE — Plan of Care (Signed)

## 2024-01-03 NOTE — Progress Notes (Signed)
 Progress Note   Patient: Michael Turner FMW:[id number] DOB: [birth year] DOA: 01/02/2024  DOS: the patient was seen and examined on 01/03/2024   Brief hospital course:  23M hx of HTN, HLD, Gilbert's syndrome, and prostate cancer s/p robotic prostatectomy, recently discharged on the 15th for acute biliary pancreatitis. Was to follow up for op lap chole, now presenting with worsening abdominal pain.  Assessment and Plan:  Acute biliary pancreatitis - Liver enzymes appear to be downtrending from previous discharge however symptoms unresolving.  AST/ALT downtrending however lipase stable/slight uptrend.  Bilirubin increasing.  Originally scheduled for outpatient lap chole.  General surgery consulted and following closely.  RUQ ultrasound noting no biliary dilation but positive Murphy's sign.  Recommendations to pursue lap chole while inpatient.  Continue to monitor LFTs.  N.p.o. after midnight.  Hypertension - Hydralazine as needed.  Hyperlipidemia - Holding for now.  Acute kidney injury - Creatinine appears to show mild improvement.  IV fluids on board.  Will recheck BMP in AM.   Subjective: Patient sitting in bedside chair, comfortable, denies any fever, chills, chest pain, nausea, vomiting, abdominal pain at this time.  Symptoms intermittent.  Physical Exam:  Vitals:   01/02/24 2333 01/03/24 0351 01/03/24 0814 01/03/24 1210  BP: 138/83 134/67 (!) 157/82 139/74  Pulse: 71 (!) 58 75 (!) 58  Resp: 18 17 17 17   Temp:  98.1 F (36.7 C) 98.7 F (37.1 C) 98.6 F (37 C)  TempSrc:  Oral Oral Oral  SpO2: 97% 100% 98% 98%  Weight:      Height:        GENERAL:  Alert, pleasant, no acute distress  HEENT:  EOMI CARDIOVASCULAR:  RRR, no murmurs appreciated RESPIRATORY:  Clear to auscultation, no wheezing, rales, or rhonchi GASTROINTESTINAL:  Soft, nontender, nondistended EXTREMITIES:  No LE edema bilaterally NEURO:  No new focal deficits appreciated SKIN:  No rashes noted PSYCH:   Appropriate mood and affect     Data Reviewed:  Imaging Studies: US  Abdomen Limited RUQ (LIVER/GB) Result Date: 01/02/2024 CLINICAL DATA:  Hyperbilirubinemia EXAM: ULTRASOUND ABDOMEN LIMITED RIGHT UPPER QUADRANT COMPARISON:  12/29/2023 FINDINGS: Gallbladder: Multiple small shadowing gallstones layer dependently within the gallbladder. No gallbladder wall thickening or pericholecystic fluid. There is a positive sonographic Murphy sign. Common bile duct: Diameter: 3 mm Liver: Diffuse increased liver echotexture compatible with hepatic steatosis. No focal liver abnormality or intrahepatic biliary duct dilation. Portal vein is patent on color Doppler imaging with normal direction of blood flow towards the liver. Other: None. IMPRESSION: 1. Cholelithiasis. There is a positive sonographic Murphy sign, but no other sonographic evidence of acute cholecystitis. If further evaluation is desired, nuclear medicine hepatobiliary scan could be considered. 2. Hepatic steatosis. Electronically Signed   By: Ozell Daring M.D.   On: 01/02/2024 15:12   MR 3D Recon At Scanner Result Date: 12/29/2023 CLINICAL DATA:  Abdominal pain and elevated liver function tests EXAM: 3-DIMENSIONAL MR IMAGE RENDERING ON ACQUISITION WORKSTATION TECHNIQUE: Multiplanar multisequence MR imaging of the abdomen was performed both before and after the administration of intravenous contrast. Heavily T2-weighted images of the biliary and pancreatic ducts were obtained. Post-processing was applied at the acquisition scanner with concurrent physician supervision which includes 3D reconstructions, MIPs, volume rendered images and/or shaded surface rendering. COMPARISON:  None Available. FINDINGS: Lower chest: No acute findings. Hepatobiliary: No mass or other parenchymal abnormality identified. No bile duct dilation. Cholelithiasis. Pancreas: No mass, inflammatory changes, or other parenchymal abnormality identified. Spleen: Within normal limits in  size and  appearance. Adrenals/Urinary Tract: No adrenal nodules. No suspicious renal masses identified. No evidence of hydronephrosis. Stomach/Bowel: Moderate hiatal hernia. Vascular/Lymphatic: No pathologically enlarged lymph nodes identified. No abdominal aortic aneurysm demonstrated. Aortic atherosclerosis. Other: None. Musculoskeletal: No suspicious bone lesions identified. IMPRESSION: 1. Cholelithiasis. No bile duct dilation. 2. Moderate hiatal hernia. Electronically Signed   By: Limin  Xu M.D.   On: 12/29/2023 17:09   MR ABDOMEN MRCP W WO CONTAST Result Date: 12/29/2023 CLINICAL DATA:  Abdominal pain and elevated liver function tests. EXAM: MRI ABDOMEN WITHOUT AND WITH CONTRAST (INCLUDING MRCP) TECHNIQUE: Multiplanar multisequence MR imaging of the abdomen was performed both before and after the administration of intravenous contrast. Heavily T2-weighted images of the biliary and pancreatic ducts were obtained, and three-dimensional MRCP images were rendered by post processing. CONTRAST:  8mL GADAVIST GADOBUTROL 1 MMOL/ML IV SOLN COMPARISON:  CT abdomen and pelvis dated 05/06/2021 FINDINGS: Lower chest: No acute findings. Hepatobiliary: No mass or other parenchymal abnormality identified. No bile duct dilation. Cholelithiasis. Pancreas: No mass, inflammatory changes, or other parenchymal abnormality identified. Spleen:  Within normal limits in size and appearance. Adrenals/Urinary Tract: No adrenal nodules. No suspicious renal masses identified. No evidence of hydronephrosis. Stomach/Bowel: Moderate hiatal hernia. Vascular/Lymphatic: No pathologically enlarged lymph nodes identified. No abdominal aortic aneurysm demonstrated. Aortic atherosclerosis. Other:  None. Musculoskeletal: No suspicious bone lesions identified. IMPRESSION: 1. Cholelithiasis. No bile duct dilation. 2. Moderate hiatal hernia. Electronically Signed   By: Limin  Xu M.D.   On: 12/29/2023 14:37    There are no new results to review at  this time.  Previous records (including but not limited to H&P, progress notes, nursing notes, TOC management) were reviewed in assessment of this patient.  Labs: CBC: Recent Labs  Lab 12/29/23 0424 01/02/24 1001 01/03/24 0638  WBC 3.8* 9.0 4.4  HGB 13.8 12.7* 11.2*  HCT 42.0 38.6* 34.0*  MCV 97.2 98.0 98.0  PLT 198 211 161   Basic Metabolic Panel: Recent Labs  Lab 12/29/23 0424 12/30/23 0430 01/02/24 1001 01/03/24 0638  NA 141 138 137 135  K 4.2 4.3 4.5 4.4  CL 105 107 104 103  CO2 24 24 22 22   GLUCOSE 163* 101* 114* 105*  BUN 20 20 10 10   CREATININE 1.63* 1.49* 1.34* 1.30*  CALCIUM 9.1 7.6* 8.6* 8.4*   Liver Function Tests: Recent Labs  Lab 12/29/23 0424 12/30/23 0430 01/02/24 1001 01/02/24 1840 01/03/24 0638  AST 650* 367* 168*  --  162*  ALT 342* 473* 222*  --  200*  ALKPHOS 129* 90 255*  --  260*  BILITOT 2.7* 5.2* 4.8* 5.4* 6.0*  PROT 6.6 5.2* 6.3*  --  5.7*  ALBUMIN 3.5 2.5* 2.8*  --  2.4*   CBG: No results for input(s): GLUCAP in the last 168 hours.  Scheduled Meds:  pantoprazole (PROTONIX) IV  40 mg Intravenous Q12H   Continuous Infusions:  lactated ringers  75 mL/hr at 01/02/24 2001   PRN Meds:.acetaminophen  **OR** acetaminophen , hydrALAZINE, ondansetron  (ZOFRAN ) IV  Family Communication: None at bedside  Disposition: Status is: Inpatient Remains inpatient appropriate because: Pancreatitis     Time spent: 35 minutes  Length of inpatient stay: 1 days  Author: Carliss LELON Canales, DO 01/03/2024 12:50 PM  For on call review [email or web address].

## 2024-01-04 ENCOUNTER — Inpatient Hospital Stay (HOSPITAL_COMMUNITY): Admitting: Certified Registered Nurse Anesthetist

## 2024-01-04 ENCOUNTER — Encounter (HOSPITAL_COMMUNITY)
Admission: EM | Disposition: A | Discharge status: Home / Self Care | Payer: Self-pay | Source: Home / Self Care | Attending: Internal Medicine

## 2024-01-04 ENCOUNTER — Inpatient Hospital Stay (HOSPITAL_COMMUNITY)

## 2024-01-04 ENCOUNTER — Encounter (HOSPITAL_COMMUNITY): Payer: Self-pay | Admitting: Internal Medicine

## 2024-01-04 DIAGNOSIS — K851 Biliary acute pancreatitis without necrosis or infection: Secondary | ICD-10-CM

## 2024-01-04 DIAGNOSIS — N179 Acute kidney failure, unspecified: Secondary | ICD-10-CM | POA: Diagnosis not present

## 2024-01-04 HISTORY — PX: CHOLECYSTECTOMY: SHX55

## 2024-01-04 LAB — CBC
HCT: 32.2 % — ABNORMAL LOW (ref 39.0–52.0)
Hemoglobin: 10.8 g/dL — ABNORMAL LOW (ref 13.0–17.0)
MCH: 32.1 pg (ref 26.0–34.0)
MCHC: 33.5 g/dL (ref 30.0–36.0)
MCV: 95.8 fL (ref 80.0–100.0)
Platelets: 193 K/uL (ref 150–400)
RBC: 3.36 MIL/uL — ABNORMAL LOW (ref 4.22–5.81)
RDW: 14.1 % (ref 11.5–15.5)
WBC: 5.9 K/uL (ref 4.0–10.5)
nRBC: 0 % (ref 0.0–0.2)

## 2024-01-04 LAB — COMPREHENSIVE METABOLIC PANEL WITH GFR
ALT: 171 U/L — ABNORMAL HIGH (ref 0–44)
AST: 117 U/L — ABNORMAL HIGH (ref 15–41)
Albumin: 2.3 g/dL — ABNORMAL LOW (ref 3.5–5.0)
Alkaline Phosphatase: 237 U/L — ABNORMAL HIGH (ref 38–126)
Anion gap: 9 (ref 5–15)
BUN: 9 mg/dL (ref 8–23)
CO2: 22 mmol/L (ref 22–32)
Calcium: 8.3 mg/dL — ABNORMAL LOW (ref 8.9–10.3)
Chloride: 107 mmol/L (ref 98–111)
Creatinine, Ser: 1.26 mg/dL — ABNORMAL HIGH (ref 0.61–1.24)
GFR, Estimated: 58 mL/min — ABNORMAL LOW (ref >60)
Glucose, Bld: 104 mg/dL — ABNORMAL HIGH (ref 70–99)
Potassium: 3.7 mmol/L (ref 3.5–5.1)
Sodium: 138 mmol/L (ref 135–145)
Total Bilirubin: 3.8 mg/dL — ABNORMAL HIGH (ref 0.0–1.2)
Total Protein: 3.8 g/dL — ABNORMAL LOW (ref 6.5–8.1)

## 2024-01-04 LAB — PROTIME-INR
INR: 1 (ref 0.8–1.2)
Prothrombin Time: 13.5 s (ref 11.4–15.2)

## 2024-01-04 LAB — MAGNESIUM: Magnesium: 1.6 mg/dL — ABNORMAL LOW (ref 1.7–2.4)

## 2024-01-04 SURGERY — LAPAROSCOPIC CHOLECYSTECTOMY WITH INTRAOPERATIVE CHOLANGIOGRAM
Anesthesia: General | Site: Abdomen

## 2024-01-04 MED ORDER — DEXAMETHASONE SOD PHOSPHATE PF 10 MG/ML IJ SOLN
INTRAMUSCULAR | Status: DC | PRN
  Administered 2024-01-04: 5 mg via INTRAVENOUS

## 2024-01-04 MED ORDER — SODIUM CHLORIDE 0.9 % IV SOLN
2.0000 g | INTRAVENOUS | Status: AC
  Administered 2024-01-04: 2 g via INTRAVENOUS
  Filled 2024-01-04: qty 20

## 2024-01-04 MED ORDER — MAGNESIUM SULFATE 2 GM/50ML IV SOLN
2.0000 g | Freq: Once | INTRAVENOUS | Status: AC
  Administered 2024-01-04: 2 g via INTRAVENOUS
  Filled 2024-01-04: qty 50

## 2024-01-04 MED ORDER — INDOCYANINE GREEN 25 MG IV SOLR
2.5000 mg | INTRAVENOUS | Status: AC
  Administered 2024-01-04: 2.5 mg via INTRAVENOUS
  Filled 2024-01-04: qty 10

## 2024-01-04 MED ORDER — ONDANSETRON HCL 4 MG/2ML IJ SOLN
INTRAMUSCULAR | Status: AC
  Filled 2024-01-04: qty 2

## 2024-01-04 MED ORDER — SODIUM CHLORIDE 0.9 % IV SOLN
INTRAVENOUS | Status: DC | PRN
  Administered 2024-01-04: 100 mL

## 2024-01-04 MED ORDER — ORAL CARE MOUTH RINSE: 15.0000 mL | Freq: Once | OROMUCOSAL | Status: AC

## 2024-01-04 MED ORDER — FENTANYL CITRATE (PF) 100 MCG/2ML IJ SOLN
INTRAMUSCULAR | Status: AC
  Filled 2024-01-04: qty 2

## 2024-01-04 MED ORDER — HYDROMORPHONE HCL 1 MG/ML IJ SOLN
0.2500 mg | INTRAMUSCULAR | Status: DC | PRN
  Administered 2024-01-04: 0.5 mg via INTRAVENOUS

## 2024-01-04 MED ORDER — FENTANYL CITRATE (PF) 250 MCG/5ML IJ SOLN
INTRAMUSCULAR | Status: AC
  Filled 2024-01-04: qty 5

## 2024-01-04 MED ORDER — SODIUM CHLORIDE 0.9 % IR SOLN
Status: DC | PRN
  Administered 2024-01-04 (×2): 1000 mL

## 2024-01-04 MED ORDER — PROPOFOL 10 MG/ML IV BOLUS
INTRAVENOUS | Status: AC
Start: 2024-01-04 — End: 2024-01-04
  Filled 2024-01-04: qty 20

## 2024-01-04 MED ORDER — BUPIVACAINE-EPINEPHRINE 0.25% -1:200000 IJ SOLN
INTRAMUSCULAR | Status: DC | PRN
  Administered 2024-01-04: 6 mL

## 2024-01-04 MED ORDER — AMISULPRIDE (ANTIEMETIC) 5 MG/2ML IV SOLN: 10.0000 mg | Freq: Once | INTRAVENOUS | Status: DC | PRN

## 2024-01-04 MED ORDER — PROPOFOL 10 MG/ML IV BOLUS
INTRAVENOUS | Status: AC
  Filled 2024-01-04: qty 20

## 2024-01-04 MED ORDER — LIDOCAINE 2% (20 MG/ML) 5 ML SYRINGE
INTRAMUSCULAR | Status: DC | PRN
  Administered 2024-01-04: 50 mg via INTRAVENOUS

## 2024-01-04 MED ORDER — SODIUM CHLORIDE (PF) 0.9 % IJ SOLN
INTRAMUSCULAR | Status: AC
  Filled 2024-01-04: qty 50

## 2024-01-04 MED ORDER — LACTATED RINGERS IV SOLN
INTRAVENOUS | Status: DC
Start: 2024-01-04 — End: 2024-01-04

## 2024-01-04 MED ORDER — ONDANSETRON HCL 4 MG/2ML IJ SOLN
INTRAMUSCULAR | Status: DC | PRN
  Administered 2024-01-04: 4 mg via INTRAVENOUS

## 2024-01-04 MED ORDER — ROCURONIUM BROMIDE 10 MG/ML (PF) SYRINGE
PREFILLED_SYRINGE | INTRAVENOUS | Status: AC
  Filled 2024-01-04: qty 10

## 2024-01-04 MED ORDER — BUPIVACAINE-EPINEPHRINE (PF) 0.25% -1:200000 IJ SOLN
INTRAMUSCULAR | Status: AC
  Filled 2024-01-04: qty 30

## 2024-01-04 MED ORDER — HYDROMORPHONE HCL 1 MG/ML IJ SOLN
INTRAMUSCULAR | Status: AC
  Filled 2024-01-04: qty 1

## 2024-01-04 MED ORDER — SUGAMMADEX SODIUM 200 MG/2ML IV SOLN
INTRAVENOUS | Status: DC | PRN
  Administered 2024-01-04: 200 mg via INTRAVENOUS

## 2024-01-04 MED ORDER — LIDOCAINE 2% (20 MG/ML) 5 ML SYRINGE
INTRAMUSCULAR | Status: AC
  Filled 2024-01-04: qty 5

## 2024-01-04 MED ORDER — 0.9 % SODIUM CHLORIDE (POUR BTL) OPTIME
TOPICAL | Status: DC | PRN
  Administered 2024-01-04: 1000 mL

## 2024-01-04 MED ORDER — CHLORHEXIDINE GLUCONATE 0.12 % MT SOLN
OROMUCOSAL | Status: AC
  Administered 2024-01-04: 15 mL via OROMUCOSAL
  Filled 2024-01-04: qty 15

## 2024-01-04 MED ORDER — FENTANYL CITRATE (PF) 100 MCG/2ML IJ SOLN
25.0000 ug | INTRAMUSCULAR | Status: DC | PRN
  Administered 2024-01-04 (×3): 50 ug via INTRAVENOUS

## 2024-01-04 MED ORDER — LABETALOL HCL 5 MG/ML IV SOLN
INTRAVENOUS | Status: DC | PRN
  Administered 2024-01-04: 5 mg via INTRAVENOUS

## 2024-01-04 MED ORDER — HYDROMORPHONE HCL 1 MG/ML IJ SOLN
1.0000 mg | INTRAMUSCULAR | Status: DC | PRN
  Administered 2024-01-04: 1 mg via INTRAVENOUS
  Filled 2024-01-04: qty 1

## 2024-01-04 MED ORDER — PROPOFOL 10 MG/ML IV BOLUS
INTRAVENOUS | Status: DC | PRN
  Administered 2024-01-04: 50 mg via INTRAVENOUS
  Administered 2024-01-04: 20 mg via INTRAVENOUS
  Administered 2024-01-04: 150 mg via INTRAVENOUS

## 2024-01-04 MED ORDER — OXYCODONE HCL 5 MG PO TABS
5.0000 mg | ORAL_TABLET | ORAL | Status: DC | PRN
Start: 2024-01-04 — End: 2024-01-05
  Administered 2024-01-04: 5 mg via ORAL
  Filled 2024-01-04: qty 1

## 2024-01-04 MED ORDER — FENTANYL CITRATE (PF) 250 MCG/5ML IJ SOLN
INTRAMUSCULAR | Status: DC | PRN
  Administered 2024-01-04: 50 ug via INTRAVENOUS
  Administered 2024-01-04: 100 ug via INTRAVENOUS
  Administered 2024-01-04 (×2): 50 ug via INTRAVENOUS

## 2024-01-04 MED ORDER — DEXMEDETOMIDINE HCL IN NACL 80 MCG/20ML IV SOLN
INTRAVENOUS | Status: DC | PRN
  Administered 2024-01-04: 8 ug via INTRAVENOUS

## 2024-01-04 MED ORDER — CHLORHEXIDINE GLUCONATE 0.12 % MT SOLN: 15.0000 mL | Freq: Once | OROMUCOSAL | Status: AC

## 2024-01-04 MED ORDER — ROCURONIUM BROMIDE 10 MG/ML (PF) SYRINGE
PREFILLED_SYRINGE | INTRAVENOUS | Status: DC | PRN
  Administered 2024-01-04: 20 mg via INTRAVENOUS
  Administered 2024-01-04: 50 mg via INTRAVENOUS

## 2024-01-04 SURGICAL SUPPLY — 36 items
BAG COUNTER SPONGE SURGICOUNT (BAG) ×1 IMPLANT
BLADE CLIPPER SURG (BLADE) IMPLANT
CANISTER SUCTION 3000ML PPV (SUCTIONS) ×1 IMPLANT
CHLORAPREP W/TINT 26 (MISCELLANEOUS) ×1 IMPLANT
CLIP APPLIE ROT 10 11.4 M/L (STAPLE) ×1 IMPLANT
COVER MAYO STAND STRL (DRAPES) ×1 IMPLANT
COVER SURGICAL LIGHT HANDLE (MISCELLANEOUS) ×1 IMPLANT
DERMABOND ADVANCED .7 DNX12 (GAUZE/BANDAGES/DRESSINGS) ×1 IMPLANT
DERMABOND ADVANCED .7 DNX6 (GAUZE/BANDAGES/DRESSINGS) IMPLANT
DRAPE C-ARM 42X120 X-RAY (DRAPES) ×1 IMPLANT
ELECTRODE REM PT RTRN 9FT ADLT (ELECTROSURGICAL) ×1 IMPLANT
GLOVE BIO SURGEON STRL SZ8 (GLOVE) ×1 IMPLANT
GLOVE BIOGEL PI IND STRL 8 (GLOVE) ×1 IMPLANT
GOWN STRL REUS W/ TWL LRG LVL3 (GOWN DISPOSABLE) ×2 IMPLANT
GOWN STRL REUS W/ TWL XL LVL3 (GOWN DISPOSABLE) ×1 IMPLANT
IRRIGATION SUCT STRKRFLW 2 WTP (MISCELLANEOUS) ×1 IMPLANT
KIT BASIN OR (CUSTOM PROCEDURE TRAY) ×1 IMPLANT
KIT IMAGING PINPOINTPAQ (MISCELLANEOUS) IMPLANT
KIT TURNOVER KIT B (KITS) ×1 IMPLANT
PAD ARMBOARD POSITIONER FOAM (MISCELLANEOUS) ×1 IMPLANT
POUCH RETRIEVAL ECOSAC 10 (ENDOMECHANICALS) ×1 IMPLANT
SCISSORS LAP 5X35 DISP (ENDOMECHANICALS) ×1 IMPLANT
SET CHOLANGIOGRAPH 5 50 .035 (SET/KITS/TRAYS/PACK) ×1 IMPLANT
SET TUBE SMOKE EVAC HIGH FLOW (TUBING) ×1 IMPLANT
SLEEVE Z-THREAD 5X100MM (TROCAR) ×1 IMPLANT
SOLN 0.9% NACL POUR BTL 1000ML (IV SOLUTION) ×1 IMPLANT
SOLN STERILE WATER BTL 1000 ML (IV SOLUTION) ×1 IMPLANT
SUT MNCRL AB 4-0 PS2 18 (SUTURE) ×1 IMPLANT
SUT VICRYL 0 UR6 27IN ABS (SUTURE) IMPLANT
TOWEL GREEN STERILE (TOWEL DISPOSABLE) ×1 IMPLANT
TOWEL GREEN STERILE FF (TOWEL DISPOSABLE) ×1 IMPLANT
TRAY LAPAROSCOPIC MC (CUSTOM PROCEDURE TRAY) ×1 IMPLANT
TROCAR 11X100 Z THREAD (TROCAR) ×1 IMPLANT
TROCAR BALLN 12MMX100 BLUNT (TROCAR) ×1 IMPLANT
TROCAR Z-THREAD OPTICAL 5X100M (TROCAR) ×1 IMPLANT
WARMER LAPAROSCOPE (MISCELLANEOUS) ×1 IMPLANT

## 2024-01-04 NOTE — Anesthesia Preprocedure Evaluation (Signed)
 Anesthesia Evaluation  Patient identified by MRN, date of birth, ID band Patient awake    Reviewed: Allergy & Precautions, NPO status , Patient's Chart, lab work & pertinent test results  Airway Mallampati: II  TM Distance: >3 FB Neck ROM: Full    Dental  (+) Dental Advisory Given   Pulmonary neg pulmonary ROS   breath sounds clear to auscultation       Cardiovascular negative cardio ROS  Rhythm:Regular Rate:Normal     Neuro/Psych negative neurological ROS     GI/Hepatic negative GI ROS, Neg liver ROS,,,  Endo/Other  negative endocrine ROS    Renal/GU CRFRenal disease     Musculoskeletal   Abdominal   Peds  Hematology negative hematology ROS (+)   Anesthesia Other Findings   Reproductive/Obstetrics                              Anesthesia Physical Anesthesia Plan  ASA: 2  Anesthesia Plan: General   Post-op Pain Management: Tylenol  PO (pre-op)*   Induction: Intravenous  PONV Risk Score and Plan: 2 and Dexamethasone , Ondansetron  and Treatment may vary due to age or medical condition  Airway Management Planned: Oral ETT  Additional Equipment: None  Intra-op Plan:   Post-operative Plan: Extubation in OR  Informed Consent: I have reviewed the patients History and Physical, chart, labs and discussed the procedure including the risks, benefits and alternatives for the proposed anesthesia with the patient or authorized representative who has indicated his/her understanding and acceptance.     Dental advisory given  Plan Discussed with:   Anesthesia Plan Comments:         Anesthesia Quick Evaluation

## 2024-01-04 NOTE — Op Note (Signed)
 Laparoscopic Cholecystectomy with IOC/ICG dye procedure Note  Indications: This patient presents with symptomatic gallbladder disease and will undergo laparoscopic cholecystectomy.  Patient has recovered from gallstone pancreatitis presents for laparoscopic cholecystectomy to prevent any further attacks.The procedure has been discussed with the patient. Operative and non operative treatments have been discussed. Risks of surgery include bleeding, infection,  Common bile duct injury,  Injury to the stomach,liver, colon,small intestine, abdominal wall,  Diaphragm,  Major blood vessels,  And the need for an open procedure.  Other risks include worsening of medical problems, death,  DVT and pulmonary embolism, and cardiovascular events.   Medical options have also been discussed. The patient has been informed of long term expectations of surgery and non surgical options,  The patient agrees to proceed.     Pre-operative Diagnosis: Gallstone pancreatitis  Post-operative Diagnosis: Gallstone pancreatitis with severe acute on chronic cholecystitis  Surgeon: Debby DELENA Shipper MD  Assistants: OR staff  Anesthesia: General endotracheal anesthesia and Local anesthesia 0.25.% bupivacaine   ASA Class: 2  Procedure Details  The patient was seen again in the Holding Room. The risks, benefits, complications, treatment options, and expected outcomes were discussed with the patient. The possibilities of reaction to medication, pulmonary aspiration, perforation of viscus, bleeding, recurrent infection, finding a normal gallbladder, the need for additional procedures, failure to diagnose a condition, the possible need to convert to an open procedure, and creating a complication requiring transfusion or operation were discussed with the patient. The patient and/or family concurred with the proposed plan, giving informed consent. The site of surgery properly noted/marked. The patient was taken to Operating Room, identified  as Michael Turner and the procedure verified as Laparoscopic Cholecystectomy with Intraoperative Cholangiograms. A Time Out was held and the above information confirmed.  Prior to the induction of general anesthesia, antibiotic prophylaxis was administered. General endotracheal anesthesia was then administered and tolerated well. After the induction, the abdomen was prepped in the usual sterile fashion. The patient was positioned in the supine position with the left arm comfortably tucked, along with some reverse Trendelenburg.  Local anesthetic agent was injected into the skin near the umbilicus and an incision made. The midline fascia was incised and the Hasson technique was used to introduce a 12 mm port under direct vision. It was secured with a figure of eight Vicryl suture placed in the usual fashion. Pneumoperitoneum was then created with CO2 and tolerated well without any adverse changes in the patient's vital signs. Additional trocars were introduced under direct vision with an 11 mm trocar in the epigastrium and 2 5 mm trocars in the right upper quadrant. All skin incisions were infiltrated with a local anesthetic agent before making the incision and placing the trocars.   The gallbladder was identified, the fundus grasped and retracted cephalad.  He had severe acute on chronic cholecystitis.  The gallbladder was decompressed with a needle.  Adhesions were lysed bluntly and with the electrocautery where indicated, taking care not to injure any adjacent organs or viscus. The infundibulum was grasped and retracted laterally, exposing the peritoneum overlying the triangle of Calot. This was then divided and exposed in a blunt fashion. The cystic duct was clearly identified and bluntly dissected circumferentially. The junctions of the gallbladder, cystic duct and common bile duct were clearly identified prior to the division of any linear structure.  ICG fluorescence was utilized.  There is no uptake in  the gallbladder indicating occlusion of the cystic duct.  The critical view was obtained.  Clips were placed on the gallbladder side. An incision was made in the cystic duct and the cholangiogram catheter introduced. The catheter was secured using an endoclip.  Multiple attempts to irrigate the catheter failed with leakage.  There appeared to be downstream obstruction precluding cholangiogram completion.  The catheter was then removed.   The cystic duct was then  ligated with surgical clips  on the patient side and  clipped on the gallbladder side and divided. The cystic artery was identified, dissected free, ligated with clips and divided as well. Posterior cystic artery clipped and divided.  The gallbladder was dissected from the liver bed in retrograde fashion with the electrocautery. The gallbladder was removed and placed into a bag.  The liver bed was irrigated and inspected. Hemostasis was achieved with the electrocautery. Copious irrigation was utilized and was repeatedly aspirated until clear all particulate matter. Hemostasis was achieved with no signs  Of bleeding or bile leakage.  The bag was extracted to the umbilicus.  Pneumoperitoneum was completely reduced after viewing removal of the trocars under direct vision. The wound was thoroughly irrigated and the fascia was then closed with a figure of eight suture; the skin was then closed with 4 0 Monocryl and a sterile dressing of Dermabond was applied.  Instrument, sponge, and needle counts were correct at closure and at the conclusion of the case.   Findings: Cholecystitis with Cholelithiasis  Estimated Blood Loss: less than 100 mL         Drains: None         Total IV Fluids: Per or record         Specimens: Gallbladder           Complications: None; patient tolerated the procedure well.         Disposition: PACU - hemodynamically stable.         Condition: stable

## 2024-01-04 NOTE — Discharge Instructions (Signed)
 [facility] ______[facility] LAPAROSCOPIC SURGERY: POST OP INSTRUCTIONS Always review your discharge instruction sheet given to you by the facility where your surgery was performed. IF YOU HAVE DISABILITY OR FAMILY LEAVE FORMS, YOU MUST BRING THEM TO THE OFFICE FOR PROCESSING.   DO NOT GIVE THEM TO YOUR DOCTOR.  A prescription for pain medication may be given to you upon discharge.  Take your pain medication as prescribed, if needed.  If narcotic pain medicine is not needed, then you may take acetaminophen  (Tylenol ) or ibuprofen  (Advil ) as needed. Take your usually prescribed medications unless otherwise directed. If you need a refill on your pain medication, please contact your pharmacy.  They will contact our office to request authorization. Prescriptions will not be filled after 5pm or on week-ends. You should follow a light diet the first few days after arrival home, such as soup and crackers, etc.  Be sure to include lots of fluids daily. Most patients will experience some swelling and bruising in the area of the incisions.  Ice packs will help.  Swelling and bruising can take several days to resolve.  It is common to experience some constipation if taking pain medication after surgery.  Increasing fluid intake and taking a stool softener (such as Colace) will usually help or prevent this problem from occurring.  A mild laxative (Milk of Magnesia or Miralax) should be taken according to package instructions if there are no bowel movements after 48 hours. Unless discharge instructions indicate otherwise, you may remove your bandages 24-48 hours after surgery, and you may shower at that time.  You may have steri-strips (small skin tapes) in place directly over the incision.  These strips should be left on the skin for 7-10 days.  If your surgeon used skin glue on the incision, you may shower in 24 hours.  The glue will flake off over the next 2-3 weeks.  Any sutures or staples will be  removed at the office during your follow-up visit. ACTIVITIES:  You may resume regular (light) daily activities beginning the next day--such as daily self-care, walking, climbing stairs--gradually increasing activities as tolerated.  You may have sexual intercourse when it is comfortable.  Refrain from any heavy lifting or straining until approved by your doctor. You may drive when you are no longer taking prescription pain medication, you can comfortably wear a seatbelt, and you can safely maneuver your car and apply brakes. RETURN TO WORK:  __________________________________________________________ Michael Turner should see your doctor in the office for a follow-up appointment approximately 2-3 weeks after your surgery.  Make sure that you call for this appointment within a day or two after you arrive home to insure a convenient appointment time. OTHER INSTRUCTIONS: __________________________________________________________________________________________________________________________ __________________________________________________________________________________________________________________________ WHEN TO CALL YOUR DOCTOR: Fever over 101.0 Inability to urinate Continued bleeding from incision. Increased pain, redness, or drainage from the incision. Increasing abdominal pain  The clinic staff is available to answer your questions during regular business hours.  Please don't hesitate to call and ask to speak to one of the nurses for clinical concerns.  If you have a medical emergency, go to the nearest emergency room or call 911.  A surgeon from [facility] is always on call at the hospital. [address], [address], [place], [place]  [address] ? [address] [address], [place], [place]   [address] [phone] ? [phone] ? FAX [phone] Web site: [email or web address]

## 2024-01-04 NOTE — Interval H&P Note (Signed)
 History and Physical Interval Note:  01/04/2024 3:28 PM  [name]  has presented today for surgery, with the diagnosis of Biliary Pancreatitis.  The various methods of treatment have been discussed with the patient and family. After consideration of risks, benefits and other options for treatment, the patient has consented to  Procedure(s): LAPAROSCOPIC CHOLECYSTECTOMY WITH INTRAOPERATIVE CHOLANGIOGRAM (N/A) as a surgical intervention.  The patient's history has been reviewed, patient examined, no change in status, stable for surgery.  I have reviewed the patient's chart and labs.  Questions were answered to the patient's satisfaction.   The procedure has been discussed with the patient. Operative and non operative treatments have been discussed. Risks of surgery include bleeding, infection,  Common bile duct injury,  Injury to the stomach,liver, colon,small intestine, abdominal wall,  Diaphragm,  Major blood vessels,  And the need for an open procedure.  Other risks include worsening of medical problems, death,  DVT and pulmonary embolism, and cardiovascular events.   Medical options have also been discussed. The patient has been informed of long term expectations of surgery and non surgical options,  The patient agrees to proceed.     Tharon Kitch A Prisilla Kocsis

## 2024-01-04 NOTE — Transfer of Care (Signed)
 Immediate Anesthesia Transfer of Care Note  Patient: Michael Turner  Procedure(s) Performed: LAPAROSCOPIC CHOLECYSTECTOMY (Abdomen)  Patient Location: PACU  Anesthesia Type:General  Level of Consciousness: drowsy  Airway & Oxygen Therapy: Patient connected to nasal cannula oxygen  Post-op Assessment: Report given to RN, Post -op Vital signs reviewed and stable, and Patient moving all extremities  Post vital signs: Reviewed and stable  Last Vitals:  Vitals Value Taken Time  BP 168/80 01/04/24 17:49  Temp    Pulse 67 01/04/24 17:52  Resp 17 01/04/24 17:52  SpO2 99 % 01/04/24 17:52  Vitals shown include unfiled device data.  Last Pain:  Vitals:   01/04/24 1525  TempSrc: Oral  PainSc:          Complications: No notable events documented.

## 2024-01-04 NOTE — H&P (View-Only) (Signed)
 [facility] Progress Note     Subjective: CC:  Denies pain at present, denies episodes of pain overnight. Denies nausea, vomiting, or diarrhea.   Objective: Vital signs in last 24 hours: Temp:  [97.8 F (36.6 C)-98.7 F (37.1 C)] 98.7 F (37.1 C) (10/20 0819) Pulse Rate:  [56-68] 57 (10/20 0819) Resp:  [16-17] 16 (10/20 0819) BP: (139-179)/(60-85) 179/85 (10/20 0819) SpO2:  [98 %-100 %] 99 % (10/20 0819) Last BM Date : 01/01/24  Intake/Output from previous day: 10/19 0701 - 10/20 0700 In: 401.3 [I.V.:401.3] Out: -  Intake/Output this shift: No intake/output data recorded.  PE: Gen:  Alert, NAD, cooperative Card:  Regular rate and rhythm Pulm:  Normal effort on room air Abd: Soft, non-tender, mild distention Skin: warm and dry, no rashes  Psych: A&Ox3   Lab Results:  Recent Labs    01/03/24 0638 01/04/24 0438  WBC 4.4 5.9  HGB 11.2* 10.8*  HCT 34.0* 32.2*  PLT 161 193   BMET Recent Labs    01/03/24 0638 01/04/24 0438  NA 135 138  K 4.4 3.7  CL 103 107  CO2 22 22  GLUCOSE 105* 104*  BUN 10 9  CREATININE 1.30* 1.26*  CALCIUM 8.4* 8.3*   PT/INR Recent Labs    01/03/24 0638 01/04/24 0438  LABPROT 12.8 13.5  INR 0.9 1.0   CMP     Component Value Date/Time   NA 138 01/04/2024 0438   K 3.7 01/04/2024 0438   CL 107 01/04/2024 0438   CO2 22 01/04/2024 0438   GLUCOSE 104 (H) 01/04/2024 0438   BUN 9 01/04/2024 0438   CREATININE 1.26 (H) 01/04/2024 0438   CALCIUM 8.3 (L) 01/04/2024 0438   PROT 3.8 (L) 01/04/2024 0438   ALBUMIN 2.3 (L) 01/04/2024 0438   AST 117 (H) 01/04/2024 0438   ALT 171 (H) 01/04/2024 0438   ALKPHOS 237 (H) 01/04/2024 0438   BILITOT 3.8 (H) 01/04/2024 0438   GFRNONAA 58 (L) 01/04/2024 0438   Lipase     Component Value Date/Time   LIPASE 517 (H) 01/03/2024 0638       Studies/Results: US  Abdomen Limited RUQ (LIVER/GB) Result Date: 01/02/2024 CLINICAL DATA:  Hyperbilirubinemia EXAM: ULTRASOUND ABDOMEN  LIMITED RIGHT UPPER QUADRANT COMPARISON:  12/29/2023 FINDINGS: Gallbladder: Multiple small shadowing gallstones layer dependently within the gallbladder. No gallbladder wall thickening or pericholecystic fluid. There is a positive sonographic Murphy sign. Common bile duct: Diameter: 3 mm Liver: Diffuse increased liver echotexture compatible with hepatic steatosis. No focal liver abnormality or intrahepatic biliary duct dilation. Portal vein is patent on color Doppler imaging with normal direction of blood flow towards the liver. Other: None. IMPRESSION: 1. Cholelithiasis. There is a positive sonographic Murphy sign, but no other sonographic evidence of acute cholecystitis. If further evaluation is desired, nuclear medicine hepatobiliary scan could be considered. 2. Hepatic steatosis. Electronically Signed   By: Ozell Daring M.D.   On: 01/02/2024 15:12    Anti-infectives: Anti-infectives (From admission, onward)    None         Latest Ref Rng & Units 01/04/2024    4:38 AM 01/03/2024    6:38 AM 01/02/2024    6:40 PM  Hepatic Function  Total Protein 6.5 - 8.1 g/dL 3.8  5.7    Albumin 3.5 - 5.0 g/dL 2.3  2.4    AST 15 - 41 U/L 117  162    ALT 0 - 44 U/L 171  200    Alk Phosphatase 38 -  126 U/L 237  260    Total Bilirubin 0.0 - 1.2 mg/dL 3.8  6.0  5.4   Bilirubin, Direct 0.0 - 0.2 mg/dL   2.6     Assessment/Plan  Acute biliary pancreatitis - admitted 10/14-10/15 and wanted to go home to be with his wife for a procedure so was trying to get interval lap chole in 1-2 weeks as an outpatient but re-presented with worsening epigastric pain.  - of note he has chronic LFT derangement secondary to Hx of Gilbert's syndrome; improving today as above - MRCP 10/14 negative for choledocholithiasis  - re-presented 10/18 with LFT as above, overall stable today but bili up some. - RUQ U/S 10/18 with no biliary dilation - lipase 517 yesterday (overall stable from 461), nontender on exam  - recommend lap  chole with IOC, possibly today but could get moved to tomorrow pending other surgical emergencies. Will leave NPO for now, this was discussed with the patient   FEN: NPO, IVF per primary VTE: LMWH ID: no current indication for abx   - per [facility] -   Hx of prostate cancer s/p prostatectomy and radiation  Hx of post-traumatic urethral stricture HTN CAD, stable without angina HLD Hx of Gilbert's syndrome CKD stage IIIa       LOS: 2 days   I reviewed nursing notes, hospitalist notes, last 24 h vitals and pain scores, last 48 h intake and output, last 24 h labs and trends, and last 24 h imaging results.  This care required moderate level of medical decision making.   Almarie Pringle, PA-C [facility] Please see Amion for pager number during day hours 7:00am-4:30pm

## 2024-01-04 NOTE — Progress Notes (Signed)
 [facility] Progress Note     Subjective: CC:  Denies pain at present, denies episodes of pain overnight. Denies nausea, vomiting, or diarrhea.   Objective: Vital signs in last 24 hours: Temp:  [97.8 F (36.6 C)-98.7 F (37.1 C)] 98.7 F (37.1 C) (10/20 0819) Pulse Rate:  [56-68] 57 (10/20 0819) Resp:  [16-17] 16 (10/20 0819) BP: (139-179)/(60-85) 179/85 (10/20 0819) SpO2:  [98 %-100 %] 99 % (10/20 0819) Last BM Date : 01/01/24  Intake/Output from previous day: 10/19 0701 - 10/20 0700 In: 401.3 [I.V.:401.3] Out: -  Intake/Output this shift: No intake/output data recorded.  PE: Gen:  Alert, NAD, cooperative Card:  Regular rate and rhythm Pulm:  Normal effort on room air Abd: Soft, non-tender, mild distention Skin: warm and dry, no rashes  Psych: A&Ox3   Lab Results:  Recent Labs    01/03/24 0638 01/04/24 0438  WBC 4.4 5.9  HGB 11.2* 10.8*  HCT 34.0* 32.2*  PLT 161 193   BMET Recent Labs    01/03/24 0638 01/04/24 0438  NA 135 138  K 4.4 3.7  CL 103 107  CO2 22 22  GLUCOSE 105* 104*  BUN 10 9  CREATININE 1.30* 1.26*  CALCIUM 8.4* 8.3*   PT/INR Recent Labs    01/03/24 0638 01/04/24 0438  LABPROT 12.8 13.5  INR 0.9 1.0   CMP     Component Value Date/Time   NA 138 01/04/2024 0438   K 3.7 01/04/2024 0438   CL 107 01/04/2024 0438   CO2 22 01/04/2024 0438   GLUCOSE 104 (H) 01/04/2024 0438   BUN 9 01/04/2024 0438   CREATININE 1.26 (H) 01/04/2024 0438   CALCIUM 8.3 (L) 01/04/2024 0438   PROT 3.8 (L) 01/04/2024 0438   ALBUMIN 2.3 (L) 01/04/2024 0438   AST 117 (H) 01/04/2024 0438   ALT 171 (H) 01/04/2024 0438   ALKPHOS 237 (H) 01/04/2024 0438   BILITOT 3.8 (H) 01/04/2024 0438   GFRNONAA 58 (L) 01/04/2024 0438   Lipase     Component Value Date/Time   LIPASE 517 (H) 01/03/2024 0638       Studies/Results: US  Abdomen Limited RUQ (LIVER/GB) Result Date: 01/02/2024 CLINICAL DATA:  Hyperbilirubinemia EXAM: ULTRASOUND ABDOMEN  LIMITED RIGHT UPPER QUADRANT COMPARISON:  12/29/2023 FINDINGS: Gallbladder: Multiple small shadowing gallstones layer dependently within the gallbladder. No gallbladder wall thickening or pericholecystic fluid. There is a positive sonographic Murphy sign. Common bile duct: Diameter: 3 mm Liver: Diffuse increased liver echotexture compatible with hepatic steatosis. No focal liver abnormality or intrahepatic biliary duct dilation. Portal vein is patent on color Doppler imaging with normal direction of blood flow towards the liver. Other: None. IMPRESSION: 1. Cholelithiasis. There is a positive sonographic Murphy sign, but no other sonographic evidence of acute cholecystitis. If further evaluation is desired, nuclear medicine hepatobiliary scan could be considered. 2. Hepatic steatosis. Electronically Signed   By: Ozell Daring M.D.   On: 01/02/2024 15:12    Anti-infectives: Anti-infectives (From admission, onward)    None         Latest Ref Rng & Units 01/04/2024    4:38 AM 01/03/2024    6:38 AM 01/02/2024    6:40 PM  Hepatic Function  Total Protein 6.5 - 8.1 g/dL 3.8  5.7    Albumin 3.5 - 5.0 g/dL 2.3  2.4    AST 15 - 41 U/L 117  162    ALT 0 - 44 U/L 171  200    Alk Phosphatase 38 -  126 U/L 237  260    Total Bilirubin 0.0 - 1.2 mg/dL 3.8  6.0  5.4   Bilirubin, Direct 0.0 - 0.2 mg/dL   2.6     Assessment/Plan  Acute biliary pancreatitis - admitted 10/14-10/15 and wanted to go home to be with his wife for a procedure so was trying to get interval lap chole in 1-2 weeks as an outpatient but re-presented with worsening epigastric pain.  - of note he has chronic LFT derangement secondary to Hx of Gilbert's syndrome; improving today as above - MRCP 10/14 negative for choledocholithiasis  - re-presented 10/18 with LFT as above, overall stable today but bili up some. - RUQ U/S 10/18 with no biliary dilation - lipase 517 yesterday (overall stable from 461), nontender on exam  - recommend lap  chole with IOC, possibly today but could get moved to tomorrow pending other surgical emergencies. Will leave NPO for now, this was discussed with the patient   FEN: NPO, IVF per primary VTE: LMWH ID: no current indication for abx   - per [facility] -   Hx of prostate cancer s/p prostatectomy and radiation  Hx of post-traumatic urethral stricture HTN CAD, stable without angina HLD Hx of Gilbert's syndrome CKD stage IIIa       LOS: 2 days   I reviewed nursing notes, hospitalist notes, last 24 h vitals and pain scores, last 48 h intake and output, last 24 h labs and trends, and last 24 h imaging results.  This care required moderate level of medical decision making.   Almarie Pringle, PA-C [facility] Please see Amion for pager number during day hours 7:00am-4:30pm

## 2024-01-04 NOTE — Plan of Care (Signed)

## 2024-01-04 NOTE — Anesthesia Procedure Notes (Signed)
 Procedure Name: Intubation Date/Time: 01/04/2024 4:13 PM  Performed by: Marva Lonni PARAS, CRNAPre-anesthesia Checklist: Patient identified, Emergency Drugs available, Suction available and Patient being monitored Patient Re-evaluated:Patient Re-evaluated prior to induction Oxygen Delivery Method: Circle System Utilized Preoxygenation: Pre-oxygenation with 100% oxygen Induction Type: IV induction Ventilation: Mask ventilation without difficulty Laryngoscope Size: Mac and 4 Grade View: Grade I Tube type: Oral Tube size: 7.5 mm Number of attempts: 1 Airway Equipment and Method: Stylet and Oral airway Placement Confirmation: ETT inserted through vocal cords under direct vision, positive ETCO2 and breath sounds checked- equal and bilateral Secured at: 23 cm Tube secured with: Tape Dental Injury: Teeth and Oropharynx as per pre-operative assessment

## 2024-01-04 NOTE — Progress Notes (Signed)
 Progress Note   Patient: Michael Turner FMW:[id number] DOB: 26-Mar-1945 DOA: 01/02/2024  DOS: the patient was seen and examined on 01/04/2024   Brief hospital course:  36M hx of HTN, HLD, Gilbert's syndrome, and prostate cancer s/p robotic prostatectomy, recently discharged on the 15th for acute biliary pancreatitis. Was to follow up for op lap chole, now presenting with worsening abdominal pain.   Assessment and Plan:   Acute biliary pancreatitis - Liver enzymes appear to be downtrending from previous discharge however symptoms initially unresolving.  AST/ALT downtrending however lipase stable/slight uptrend.  Bilirubin increasing.  Originally scheduled for outpatient lap chole.  General surgery consulted and following closely.  RUQ ultrasound noting no biliary dilation but positive Murphy's sign.  Recommendations to pursue lap chole while inpatient.  Continue to monitor LFTs.  Lap chole later today.  Hypertension - Hydralazine as needed.   Hyperlipidemia - Holding for now.   Acute kidney injury - Creatinine appears to show mild improvement.  IV fluids on board.  Will recheck BMP in AM.   Subjective: Patient resting comfortably this morning.  Denies any abdominal pain, nausea, vomiting.  Has not eaten anything this morning.  Eager for pending procedure although timing is in question still.  Physical Exam:  Vitals:   01/03/24 1210 01/03/24 1701 01/04/24 0527 01/04/24 0819  BP: 139/74 (!) 152/60 (!) 160/71 (!) 179/85  Pulse: (!) 58 68 (!) 56 (!) 57  Resp: 17 16  16   Temp: 98.6 F (37 C) 97.8 F (36.6 C) 97.8 F (36.6 C) 98.7 F (37.1 C)  TempSrc: Oral Oral  Oral  SpO2: 98% 100% 98% 99%  Weight:      Height:        GENERAL:  Alert, pleasant, no acute distress  HEENT:  EOMI CARDIOVASCULAR:  RRR, no murmurs appreciated RESPIRATORY:  Clear to auscultation, no wheezing, rales, or rhonchi GASTROINTESTINAL:  Soft, minimally tender, nondistended EXTREMITIES:  No LE edema  bilaterally NEURO:  No new focal deficits appreciated SKIN:  No rashes noted PSYCH:  Appropriate mood and affect    Data Reviewed:  Imaging Studies: US  Abdomen Limited RUQ (LIVER/GB) Result Date: 01/02/2024 CLINICAL DATA:  Hyperbilirubinemia EXAM: ULTRASOUND ABDOMEN LIMITED RIGHT UPPER QUADRANT COMPARISON:  12/29/2023 FINDINGS: Gallbladder: Multiple small shadowing gallstones layer dependently within the gallbladder. No gallbladder wall thickening or pericholecystic fluid. There is a positive sonographic Murphy sign. Common bile duct: Diameter: 3 mm Liver: Diffuse increased liver echotexture compatible with hepatic steatosis. No focal liver abnormality or intrahepatic biliary duct dilation. Portal vein is patent on color Doppler imaging with normal direction of blood flow towards the liver. Other: None. IMPRESSION: 1. Cholelithiasis. There is a positive sonographic Murphy sign, but no other sonographic evidence of acute cholecystitis. If further evaluation is desired, nuclear medicine hepatobiliary scan could be considered. 2. Hepatic steatosis. Electronically Signed   By: Ozell Daring M.D.   On: 01/02/2024 15:12   MR 3D Recon At Scanner Result Date: 12/29/2023 CLINICAL DATA:  Abdominal pain and elevated liver function tests EXAM: 3-DIMENSIONAL MR IMAGE RENDERING ON ACQUISITION WORKSTATION TECHNIQUE: Multiplanar multisequence MR imaging of the abdomen was performed both before and after the administration of intravenous contrast. Heavily T2-weighted images of the biliary and pancreatic ducts were obtained. Post-processing was applied at the acquisition scanner with concurrent physician supervision which includes 3D reconstructions, MIPs, volume rendered images and/or shaded surface rendering. COMPARISON:  None Available. FINDINGS: Lower chest: No acute findings. Hepatobiliary: No mass or other parenchymal abnormality identified. No bile duct  dilation. Cholelithiasis. Pancreas: No mass, inflammatory  changes, or other parenchymal abnormality identified. Spleen: Within normal limits in size and appearance. Adrenals/Urinary Tract: No adrenal nodules. No suspicious renal masses identified. No evidence of hydronephrosis. Stomach/Bowel: Moderate hiatal hernia. Vascular/Lymphatic: No pathologically enlarged lymph nodes identified. No abdominal aortic aneurysm demonstrated. Aortic atherosclerosis. Other: None. Musculoskeletal: No suspicious bone lesions identified. IMPRESSION: 1. Cholelithiasis. No bile duct dilation. 2. Moderate hiatal hernia. Electronically Signed   By: Limin  Xu M.D.   On: 12/29/2023 17:09   MR ABDOMEN MRCP W WO CONTAST Result Date: 12/29/2023 CLINICAL DATA:  Abdominal pain and elevated liver function tests. EXAM: MRI ABDOMEN WITHOUT AND WITH CONTRAST (INCLUDING MRCP) TECHNIQUE: Multiplanar multisequence MR imaging of the abdomen was performed both before and after the administration of intravenous contrast. Heavily T2-weighted images of the biliary and pancreatic ducts were obtained, and three-dimensional MRCP images were rendered by post processing. CONTRAST:  8mL GADAVIST GADOBUTROL 1 MMOL/ML IV SOLN COMPARISON:  CT abdomen and pelvis dated 05/06/2021 FINDINGS: Lower chest: No acute findings. Hepatobiliary: No mass or other parenchymal abnormality identified. No bile duct dilation. Cholelithiasis. Pancreas: No mass, inflammatory changes, or other parenchymal abnormality identified. Spleen:  Within normal limits in size and appearance. Adrenals/Urinary Tract: No adrenal nodules. No suspicious renal masses identified. No evidence of hydronephrosis. Stomach/Bowel: Moderate hiatal hernia. Vascular/Lymphatic: No pathologically enlarged lymph nodes identified. No abdominal aortic aneurysm demonstrated. Aortic atherosclerosis. Other:  None. Musculoskeletal: No suspicious bone lesions identified. IMPRESSION: 1. Cholelithiasis. No bile duct dilation. 2. Moderate hiatal hernia. Electronically Signed    By: Limin  Xu M.D.   On: 12/29/2023 14:37    There are no new results to review at this time.  Previous records (including but not limited to H&P, progress notes, nursing notes, TOC management) were reviewed in assessment of this patient.  Labs: CBC: Recent Labs  Lab 12/29/23 0424 01/02/24 1001 01/03/24 0638 01/04/24 0438  WBC 3.8* 9.0 4.4 5.9  HGB 13.8 12.7* 11.2* 10.8*  HCT 42.0 38.6* 34.0* 32.2*  MCV 97.2 98.0 98.0 95.8  PLT 198 211 161 193   Basic Metabolic Panel: Recent Labs  Lab 12/29/23 0424 12/30/23 0430 01/02/24 1001 01/03/24 0638 01/04/24 0438  NA 141 138 137 135 138  K 4.2 4.3 4.5 4.4 3.7  CL 105 107 104 103 107  CO2 24 24 22 22 22   GLUCOSE 163* 101* 114* 105* 104*  BUN 20 20 10 10 9   CREATININE 1.63* 1.49* 1.34* 1.30* 1.26*  CALCIUM 9.1 7.6* 8.6* 8.4* 8.3*  MG  --   --   --   --  1.6*   Liver Function Tests: Recent Labs  Lab 12/29/23 0424 12/30/23 0430 01/02/24 1001 01/02/24 1840 01/03/24 0638 01/04/24 0438  AST 650* 367* 168*  --  162* 117*  ALT 342* 473* 222*  --  200* 171*  ALKPHOS 129* 90 255*  --  260* 237*  BILITOT 2.7* 5.2* 4.8* 5.4* 6.0* 3.8*  PROT 6.6 5.2* 6.3*  --  5.7* 3.8*  ALBUMIN 3.5 2.5* 2.8*  --  2.4* 2.3*   CBG: No results for input(s): GLUCAP in the last 168 hours.  Scheduled Meds:  pantoprazole (PROTONIX) IV  40 mg Intravenous Q12H   Continuous Infusions:  lactated ringers  75 mL/hr at 01/04/24 0113   PRN Meds:.acetaminophen  **OR** acetaminophen , hydrALAZINE, ondansetron  (ZOFRAN ) IV  Family Communication: None at bedside  Disposition: Status is: Inpatient Remains inpatient appropriate because: Pending lap chole     Time spent: 31 minutes  Length of inpatient stay: 2 days  Author: Carliss LELON Canales, DO 01/04/2024 11:59 AM  For on call review [email or web address].

## 2024-01-05 ENCOUNTER — Encounter (HOSPITAL_COMMUNITY): Payer: Self-pay | Admitting: Surgery

## 2024-01-05 ENCOUNTER — Other Ambulatory Visit (HOSPITAL_COMMUNITY): Payer: Self-pay

## 2024-01-05 DIAGNOSIS — K851 Biliary acute pancreatitis without necrosis or infection: Secondary | ICD-10-CM | POA: Diagnosis not present

## 2024-01-05 LAB — COMPREHENSIVE METABOLIC PANEL WITH GFR
ALT: 167 U/L — ABNORMAL HIGH (ref 0–44)
AST: 122 U/L — ABNORMAL HIGH (ref 15–41)
Albumin: 2.1 g/dL — ABNORMAL LOW (ref 3.5–5.0)
Alkaline Phosphatase: 229 U/L — ABNORMAL HIGH (ref 38–126)
Anion gap: 7 (ref 5–15)
BUN: 10 mg/dL (ref 8–23)
CO2: 25 mmol/L (ref 22–32)
Calcium: 8.1 mg/dL — ABNORMAL LOW (ref 8.9–10.3)
Chloride: 104 mmol/L (ref 98–111)
Creatinine, Ser: 1.21 mg/dL (ref 0.61–1.24)
GFR, Estimated: >60 mL/min (ref >60)
Glucose, Bld: 147 mg/dL — ABNORMAL HIGH (ref 70–99)
Potassium: 4.5 mmol/L (ref 3.5–5.1)
Sodium: 136 mmol/L (ref 135–145)
Total Bilirubin: 2.6 mg/dL — ABNORMAL HIGH (ref 0.0–1.2)
Total Protein: 5.5 g/dL — ABNORMAL LOW (ref 6.5–8.1)

## 2024-01-05 LAB — CBC
HCT: 33 % — ABNORMAL LOW (ref 39.0–52.0)
Hemoglobin: 11.1 g/dL — ABNORMAL LOW (ref 13.0–17.0)
MCH: 32.3 pg (ref 26.0–34.0)
MCHC: 33.6 g/dL (ref 30.0–36.0)
MCV: 95.9 fL (ref 80.0–100.0)
Platelets: 262 K/uL (ref 150–400)
RBC: 3.44 MIL/uL — ABNORMAL LOW (ref 4.22–5.81)
RDW: 13.9 % (ref 11.5–15.5)
WBC: 10.6 K/uL — ABNORMAL HIGH (ref 4.0–10.5)
nRBC: 0 % (ref 0.0–0.2)

## 2024-01-05 LAB — LIPASE, BLOOD: Lipase: 48 U/L (ref 11–51)

## 2024-01-05 LAB — MAGNESIUM: Magnesium: 1.9 mg/dL (ref 1.7–2.4)

## 2024-01-05 MED ORDER — TRAMADOL HCL 50 MG PO TABS: 50.0000 mg | ORAL_TABLET | Freq: Four times a day (QID) | ORAL | 0 refills | Status: AC | PRN

## 2024-01-05 MED ORDER — ACETAMINOPHEN 325 MG PO TABS: 650.0000 mg | ORAL_TABLET | Freq: Four times a day (QID) | ORAL | Status: AC | PRN

## 2024-01-05 MED ORDER — TRAMADOL HCL 50 MG PO TABS: 50.0000 mg | ORAL_TABLET | Freq: Four times a day (QID) | ORAL | 0 refills | Status: DC | PRN

## 2024-01-05 MED ORDER — ONDANSETRON HCL 4 MG PO TABS: 4.0000 mg | ORAL_TABLET | Freq: Three times a day (TID) | ORAL | 0 refills | Status: AC | PRN

## 2024-01-05 MED ORDER — ONDANSETRON HCL 4 MG/2ML IJ SOLN
4.0000 mg | Freq: Four times a day (QID) | INTRAMUSCULAR | 0 refills | Status: DC | PRN
  Filled 2024-01-05: qty 2, 1d supply, fill #0

## 2024-01-05 MED ORDER — TRAMADOL HCL 50 MG PO TABS
50.0000 mg | ORAL_TABLET | Freq: Four times a day (QID) | ORAL | 0 refills | Status: DC | PRN
  Filled 2024-01-05: qty 15, 4d supply, fill #0

## 2024-01-05 NOTE — Progress Notes (Signed)
 Transition of Care [facility]) - Inpatient Brief Assessment   Patient Details  Name: Michael Turner MRN: [id number] Date of Birth: 07/09/1945  Transition of [facility]) CM/SW Contact:    Rosaline JONELLE Joe, RN Phone Number: 01/05/2024, 11:15 AM   Clinical Narrative: Patient admitted to hospital and is S/P Lap cholecystectomy.  No IP Care management needs before patient discharges home today.   Transition of Care Asessment: Insurance and Status: (P) Insurance coverage has been reviewed Patient has primary care physician: (P) Yes Home environment has been reviewed: (P) from home Prior level of function:: (P) Independent Prior/Current Home Services: (P) No current home services Social Drivers of Health Review: (P) SDOH reviewed no interventions necessary Readmission risk has been reviewed: (P) Yes Transition of care needs: (P) no transition of care needs at this time

## 2024-01-05 NOTE — Progress Notes (Signed)
 [facility] Progress Note  1 Day Post-Op  Subjective: CC:  Overall doing well, sore around incisions. Tolerating FLD for breakfast. Not using narcotics at present.   Objective: Vital signs in last 24 hours: Temp:  [97.6 F (36.4 C)-98.7 F (37.1 C)] 97.6 F (36.4 C) (10/21 0733) Pulse Rate:  [62-83] 65 (10/21 0733) Resp:  [12-19] 19 (10/21 0733) BP: (134-175)/(67-80) 136/75 (10/21 0733) SpO2:  [91 %-100 %] 98 % (10/21 0733) Weight:  [83.5 kg] 83.5 kg (10/20 1525) Last BM Date : 01/01/24  Intake/Output from previous day: 10/20 0701 - 10/21 0700 In: 3677.9 [P.O.:400; I.V.:3177.9; IV Piggyback:100] Out: 600 [Urine:575; Blood:25] Intake/Output this shift: No intake/output data recorded.  PE: Gen:  Alert, NAD, pleasant Card:  Regular rate and rhyth Pulm:  Normal effort Abd: firm, overall non-distended, incisions C/D/I with skin glue, mild ecchymosis but no cellulitis. Appropriately tender, no peritonitis  Skin: warm and dry, no rashes  Psych: A&Ox3   Lab Results:  Recent Labs    01/04/24 0438 01/05/24 0439  WBC 5.9 10.6*  HGB 10.8* 11.1*  HCT 32.2* 33.0*  PLT 193 262   BMET Recent Labs    01/04/24 0438 01/05/24 0439  NA 138 136  K 3.7 4.5  CL 107 104  CO2 22 25  GLUCOSE 104* 147*  BUN 9 10  CREATININE 1.26* 1.21  CALCIUM 8.3* 8.1*   PT/INR Recent Labs    01/03/24 0638 01/04/24 0438  LABPROT 12.8 13.5  INR 0.9 1.0   CMP     Component Value Date/Time   NA 136 01/05/2024 0439   K 4.5 01/05/2024 0439   CL 104 01/05/2024 0439   CO2 25 01/05/2024 0439   GLUCOSE 147 (H) 01/05/2024 0439   BUN 10 01/05/2024 0439   CREATININE 1.21 01/05/2024 0439   CALCIUM 8.1 (L) 01/05/2024 0439   PROT 5.5 (L) 01/05/2024 0439   ALBUMIN 2.1 (L) 01/05/2024 0439   AST 122 (H) 01/05/2024 0439   ALT 167 (H) 01/05/2024 0439   ALKPHOS 229 (H) 01/05/2024 0439   BILITOT 2.6 (H) 01/05/2024 0439   GFRNONAA >60 01/05/2024 0439   Lipase     Component Value  Date/Time   LIPASE 48 01/05/2024 0439       Studies/Results: No results found.  Anti-infectives: Anti-infectives (From admission, onward)    Start     Dose/Rate Route Frequency Ordered Stop   01/04/24 1500  cefTRIAXone (ROCEPHIN) 2 g in sodium chloride 0.9 % 100 mL IVPB       Note to Pharmacy: Pharmacy may adjust dosing strength / duration / interval for maximal efficacy   2 g 200 mL/hr over 30 Minutes Intravenous On call to O.R. 01/04/24 1449 01/04/24 2000        Assessment/Plan  Biliary pancreatitis  S/p lap chole 10/20 Dr. Vanderbilt IOC unable to be performed intra-operatively.  LFTs improved and unreliable in the setting of Gilbert's. He has no deep epigastric pain today, no nausea/vomiting.   Pain controlled, tolerating PO, mobilizing, and stable for discharge. Follow up will be provided for him. I sent tramadol to his preferred pharmacy. Dietary, bathing, and physical activity recommendations discussed.     LOS: 3 days   I reviewed nursing notes, hospitalist notes, last 24 h vitals and pain scores, last 48 h intake and output, last 24 h labs and trends, and last 24 h imaging results.  This care required straight-forward level of medical decision making.   Almarie Pringle, PA-C [facility] Please see Amion for pager number during day hours 7:00am-4:30pm

## 2024-01-05 NOTE — Discharge Summary (Addendum)
 Physician Discharge Summary   Patient: Michael Turner MRN: [id number] DOB: 22-Sep-1945  Admit date:     01/02/2024  Discharge date: 01/05/24  Discharge Physician: Carliss LELON Canales   PCP: Shayne Anes, MD   Recommendations at discharge:    Pt to be discharged home.   If you experience worsening fever, chills, chest pain, shortness of breath, or other concerning symptoms, please call your PCP or go to the emergency department immediately.  Discharge Diagnoses: Principal Problem:   Acute gallstone pancreatitis  Resolved Problems:   * No resolved hospital problems. [facility] Course:  70M hx of HTN, HLD, Gilbert's syndrome, and prostate cancer s/p robotic prostatectomy, recently discharged on the 15th for acute biliary pancreatitis. Was to follow up for op lap chole, now presenting with worsening abdominal pain.   Assessment and Plan:   Acute biliary pancreatitis - Liver enzymes appear to be downtrending from previous discharge however symptoms initially unresolving.  AST/ALT downtrending however lipase stable/slight uptrend.  Bilirubin increasing.  Originally scheduled for outpatient lap chole.  General surgery consulted and following closely.  RUQ ultrasound noting no biliary dilation but positive Murphy's sign.  Recommendations to pursue lap chole while inpatient.  Lap chole performed 10/20.  No complications.  Doing well this morning.  Tolerating diet, eager for discharge.  Follow-up with general surgery.  Tramadol prescription provided as well as some Zofran .  Hypertension/hyperlipidemia - Resume home medications  Acute kidney injury - Resolved after IV fluid hydration  Consultants: General surgery Procedures performed: Laparoscopic cholecystectomy Disposition: Home Diet recommendation:  Discharge Diet Orders (From admission, onward)     Start     Ordered   01/05/24 0000  Diet - low sodium heart healthy        01/05/24 1010           Cardiac diet  DISCHARGE  MEDICATION: Allergies as of 01/05/2024   No Known Allergies      Medication List     PAUSE taking these medications    atorvastatin 40 MG tablet Wait to take this until your doctor or other care provider tells you to start again. Commonly known as: LIPITOR Take 40 mg by mouth daily.       STOP taking these medications    oxycodone 5 MG capsule Commonly known as: OXY-IR       TAKE these medications    acetaminophen  325 MG tablet Commonly known as: TYLENOL  Take 2 tablets (650 mg total) by mouth every 6 (six) hours as needed for mild pain (pain score 1-3) or fever (or Fever >/= 101).   CENTRUM SILVER 50+MEN PO Take 1 tablet by mouth daily.   ezetimibe 10 MG tablet Commonly known as: ZETIA Take 10 mg by mouth daily.   famotidine-calcium carbonate-magnesium hydroxide 10-800-165 MG chewable tablet Commonly known as: PEPCID COMPLETE Chew 1 tablet by mouth daily as needed.   ibuprofen 200 MG tablet Commonly known as: ADVIL Take 200 mg by mouth every 6 (six) hours as needed.   losartan 100 MG tablet Commonly known as: COZAAR Take 100 mg by mouth daily.   omeprazole 10 MG capsule Commonly known as: PRILOSEC Take 10 mg by mouth daily.   ondansetron  4 MG tablet Commonly known as: ZOFRAN  Take 1 tablet (4 mg total) by mouth every 8 (eight) hours as needed for nausea or vomiting.   traMADol 50 MG tablet Commonly known as: ULTRAM Take 1 tablet (50 mg total) by mouth every 6 (six) hours as needed  for severe pain (pain score 7-10).   Vitamin D-1000 Max St 25 MCG (1000 UT) tablet Generic drug: Cholecalciferol Take 1,000 Units by mouth daily.               Discharge Care Instructions  (From admission, onward)           Start     Ordered   01/05/24 0000  Discharge wound care:       Comments: Keep clean and dry   01/05/24 1010            Follow-up Information     Maczis, Puja Gosai, PA-C Follow up.   Specialty: General Surgery Why: our office  is scheduling you for follow up in 3-4 weeks. call to confirm. Contact information: [address] [place] [address] [phone]                 Discharge Exam: Fredricka Weights   01/02/24 9043 01/04/24 1525  Weight: 83.6 kg 83.5 kg    GENERAL:  Alert, pleasant, no acute distress  HEENT:  EOMI CARDIOVASCULAR:  RRR, no murmurs appreciated RESPIRATORY:  Clear to auscultation, no wheezing, rales, or rhonchi GASTROINTESTINAL:  Soft, nontender, nondistended EXTREMITIES:  No LE edema bilaterally NEURO:  No new focal deficits appreciated SKIN: Bandages over surgical incisions, no rashes noted PSYCH:  Appropriate mood and affect     Condition at discharge: improving  The results of significant diagnostics from this hospitalization (including imaging, microbiology, ancillary and laboratory) are listed below for reference.   Imaging Studies: US  Abdomen Limited RUQ (LIVER/GB) Result Date: 01/02/2024 CLINICAL DATA:  Hyperbilirubinemia EXAM: ULTRASOUND ABDOMEN LIMITED RIGHT UPPER QUADRANT COMPARISON:  12/29/2023 FINDINGS: Gallbladder: Multiple small shadowing gallstones layer dependently within the gallbladder. No gallbladder wall thickening or pericholecystic fluid. There is a positive sonographic Murphy sign. Common bile duct: Diameter: 3 mm Liver: Diffuse increased liver echotexture compatible with hepatic steatosis. No focal liver abnormality or intrahepatic biliary duct dilation. Portal vein is patent on color Doppler imaging with normal direction of blood flow towards the liver. Other: None. IMPRESSION: 1. Cholelithiasis. There is a positive sonographic Murphy sign, but no other sonographic evidence of acute cholecystitis. If further evaluation is desired, nuclear medicine hepatobiliary scan could be considered. 2. Hepatic steatosis. Electronically Signed   By: Ozell Daring M.D.   On: 01/02/2024 15:12   MR 3D Recon At Scanner Result Date: 12/29/2023 CLINICAL DATA:   Abdominal pain and elevated liver function tests EXAM: 3-DIMENSIONAL MR IMAGE RENDERING ON ACQUISITION WORKSTATION TECHNIQUE: Multiplanar multisequence MR imaging of the abdomen was performed both before and after the administration of intravenous contrast. Heavily T2-weighted images of the biliary and pancreatic ducts were obtained. Post-processing was applied at the acquisition scanner with concurrent physician supervision which includes 3D reconstructions, MIPs, volume rendered images and/or shaded surface rendering. COMPARISON:  None Available. FINDINGS: Lower chest: No acute findings. Hepatobiliary: No mass or other parenchymal abnormality identified. No bile duct dilation. Cholelithiasis. Pancreas: No mass, inflammatory changes, or other parenchymal abnormality identified. Spleen: Within normal limits in size and appearance. Adrenals/Urinary Tract: No adrenal nodules. No suspicious renal masses identified. No evidence of hydronephrosis. Stomach/Bowel: Moderate hiatal hernia. Vascular/Lymphatic: No pathologically enlarged lymph nodes identified. No abdominal aortic aneurysm demonstrated. Aortic atherosclerosis. Other: None. Musculoskeletal: No suspicious bone lesions identified. IMPRESSION: 1. Cholelithiasis. No bile duct dilation. 2. Moderate hiatal hernia. Electronically Signed   By: Limin  Xu M.D.   On: 12/29/2023 17:09   MR ABDOMEN MRCP W WO  CONTAST Result Date: 12/29/2023 CLINICAL DATA:  Abdominal pain and elevated liver function tests. EXAM: MRI ABDOMEN WITHOUT AND WITH CONTRAST (INCLUDING MRCP) TECHNIQUE: Multiplanar multisequence MR imaging of the abdomen was performed both before and after the administration of intravenous contrast. Heavily T2-weighted images of the biliary and pancreatic ducts were obtained, and three-dimensional MRCP images were rendered by post processing. CONTRAST:  8mL GADAVIST GADOBUTROL 1 MMOL/ML IV SOLN COMPARISON:  CT abdomen and pelvis dated 05/06/2021 FINDINGS: Lower  chest: No acute findings. Hepatobiliary: No mass or other parenchymal abnormality identified. No bile duct dilation. Cholelithiasis. Pancreas: No mass, inflammatory changes, or other parenchymal abnormality identified. Spleen:  Within normal limits in size and appearance. Adrenals/Urinary Tract: No adrenal nodules. No suspicious renal masses identified. No evidence of hydronephrosis. Stomach/Bowel: Moderate hiatal hernia. Vascular/Lymphatic: No pathologically enlarged lymph nodes identified. No abdominal aortic aneurysm demonstrated. Aortic atherosclerosis. Other:  None. Musculoskeletal: No suspicious bone lesions identified. IMPRESSION: 1. Cholelithiasis. No bile duct dilation. 2. Moderate hiatal hernia. Electronically Signed   By: Limin  Xu M.D.   On: 12/29/2023 14:37    Microbiology: Results for orders placed or performed in visit on 08/14/20  Novel Coronavirus, NAA ([facility])     Status: None   Collection Time: 08/14/20 11:16 AM   Specimen: Nasopharyngeal(NP) swabs in vial transport medium   Nasopharynge  Testing  Result Value Ref Range Status   SARS-CoV-2, NAA Not Detected Not Detected Final    Comment: This nucleic acid amplification test was developed and its performance characteristics determined by [employer]. Nucleic acid amplification tests include RT-PCR and TMA. This test has not been FDA cleared or approved. This test has been authorized by FDA under an Emergency Use Authorization (EUA). This test is only authorized for the duration of time the declaration that circumstances exist justifying the authorization of the emergency use of in vitro diagnostic tests for detection of SARS-CoV-2 virus and/or diagnosis of COVID-19 infection under section 564(b)(1) of the Act, 21 U.S.C. 639aaa-6(a) (1), unless the authorization is terminated or revoked sooner. When diagnostic testing is negative, the possibility of a false negative result should be considered in the context of a  patient's recent exposures and the presence of clinical signs and symptoms consistent with COVID-19. An individual without symptoms of COVID-19 and who is not shedding SARS-CoV-2 virus wo uld expect to have a negative (not detected) result in this assay.   SARS-COV-2, NAA 2 DAY TAT     Status: None   Collection Time: 08/14/20 11:16 AM   Nasopharynge  Testing  Result Value Ref Range Status   SARS-CoV-2, NAA 2 DAY TAT Performed  Final    Labs: CBC: Recent Labs  Lab 01/02/24 1001 01/03/24 0638 01/04/24 0438 01/05/24 0439  WBC 9.0 4.4 5.9 10.6*  HGB 12.7* 11.2* 10.8* 11.1*  HCT 38.6* 34.0* 32.2* 33.0*  MCV 98.0 98.0 95.8 95.9  PLT 211 161 193 262   Basic Metabolic Panel: Recent Labs  Lab 12/30/23 0430 01/02/24 1001 01/03/24 0638 01/04/24 0438 01/05/24 0439  NA 138 137 135 138 136  K 4.3 4.5 4.4 3.7 4.5  CL 107 104 103 107 104  CO2 24 22 22 22 25   GLUCOSE 101* 114* 105* 104* 147*  BUN 20 10 10 9 10   CREATININE 1.49* 1.34* 1.30* 1.26* 1.21  CALCIUM 7.6* 8.6* 8.4* 8.3* 8.1*  MG  --   --   --  1.6* 1.9   Liver Function Tests: Recent Labs  Lab 12/30/23 0430 01/02/24 1001 01/02/24  1840 01/03/24 0638 01/04/24 0438 01/05/24 0439  AST 367* 168*  --  162* 117* 122*  ALT 473* 222*  --  200* 171* 167*  ALKPHOS 90 255*  --  260* 237* 229*  BILITOT 5.2* 4.8* 5.4* 6.0* 3.8* 2.6*  PROT 5.2* 6.3*  --  5.7* 3.8* 5.5*  ALBUMIN 2.5* 2.8*  --  2.4* 2.3* 2.1*   CBG: No results for input(s): GLUCAP in the last 168 hours.  Discharge time spent: 25 minutes.  Length of inpatient stay: 3 days  Signed: Carliss LELON Canales, DO [facility] 01/05/2024

## 2024-01-05 NOTE — Anesthesia Postprocedure Evaluation (Signed)
 Anesthesia Post Note  Patient: Michael Turner  Procedure(s) Performed: LAPAROSCOPIC CHOLECYSTECTOMY (Abdomen)     Patient location during evaluation: PACU Anesthesia Type: General Level of consciousness: awake and alert Pain management: pain level controlled Vital Signs Assessment: post-procedure vital signs reviewed and stable Respiratory status: spontaneous breathing, nonlabored ventilation, respiratory function stable and patient connected to nasal cannula oxygen Cardiovascular status: blood pressure returned to baseline and stable Postop Assessment: no apparent nausea or vomiting Anesthetic complications: no   No notable events documented.  Last Vitals:  Vitals:   01/05/24 0444 01/05/24 0733  BP: 134/67 136/75  Pulse: 62 65  Resp: 17 19  Temp: 36.7 C 36.4 C  SpO2: 100% 98%    Last Pain:  Vitals:   01/05/24 0509  TempSrc:   PainSc: Harlow Epifanio Lamar FORBES

## 2024-01-06 LAB — SURGICAL PATHOLOGY

## 2024-01-08 ENCOUNTER — Ambulatory Visit (HOSPITAL_COMMUNITY)

## 2024-01-08 ENCOUNTER — Encounter (HOSPITAL_COMMUNITY): Payer: Self-pay

## 2024-01-15 ENCOUNTER — Encounter (HOSPITAL_COMMUNITY)

## 2024-01-18 DIAGNOSIS — E785 Hyperlipidemia, unspecified: Secondary | ICD-10-CM | POA: Diagnosis not present

## 2024-01-18 DIAGNOSIS — I10 Essential (primary) hypertension: Secondary | ICD-10-CM | POA: Diagnosis not present

## 2024-01-19 ENCOUNTER — Ambulatory Visit (HOSPITAL_COMMUNITY): Admit: 2024-01-19 | Admitting: General Surgery

## 2024-01-19 SURGERY — LAPAROSCOPIC CHOLECYSTECTOMY
Anesthesia: General

## 2024-03-18 NOTE — Progress Notes (Signed)
 Michael Turner                                          MRN: [id number]   03/18/2024   The [facility] Quality Team Specialist reviewed this patient medical record for the purposes of chart review for care gap closure. The following were reviewed: chart review for care gap closure-controlling blood pressure.    [facility] Quality Team
